# Patient Record
Sex: Male | Born: 1940 | Race: Black or African American | Hispanic: No | Marital: Married | Smoking: Current every day smoker
Health system: Southern US, Community
[De-identification: ages and names within clinical notes are randomized; demographics above are authoritative.]

## PROBLEM LIST (undated history)

## (undated) DIAGNOSIS — I10 Essential (primary) hypertension: Secondary | ICD-10-CM

## (undated) DIAGNOSIS — E119 Type 2 diabetes mellitus without complications: Secondary | ICD-10-CM

## (undated) DIAGNOSIS — E785 Hyperlipidemia, unspecified: Secondary | ICD-10-CM

## (undated) HISTORY — PX: APPENDECTOMY: SHX54

## (undated) HISTORY — DX: Essential (primary) hypertension: I10

## (undated) HISTORY — DX: Type 2 diabetes mellitus without complications: E11.9

## (undated) HISTORY — DX: Hyperlipidemia, unspecified: E78.5

---

## 2013-12-19 ENCOUNTER — Ambulatory Visit (INDEPENDENT_AMBULATORY_CARE_PROVIDER_SITE_OTHER): Payer: Medicare Other | Admitting: Podiatry

## 2013-12-19 ENCOUNTER — Encounter: Payer: Self-pay | Admitting: Podiatry

## 2013-12-19 VITALS — BP 170/92 | HR 78 | Resp 15 | Ht 74.5 in | Wt 230.0 lb

## 2013-12-19 DIAGNOSIS — L608 Other nail disorders: Secondary | ICD-10-CM

## 2013-12-19 NOTE — Progress Notes (Signed)
   Subjective:    Patient ID: Matthew Hooper, male    DOB: 06/01/1940, 73 y.o.   MRN: [id number]  HPI Comments: N debridement L 10 toenails D and O long-term C elongated, tender toenails A diabetes, difficult to cut T none  Diabetes  patient is had previous podiatric care while living in [place]    Review of Systems  All other systems reviewed and are negative.      Objective:   Physical Exam   Orientated 3  Vascular: DP pulses 2/4 bilaterally PT pulses 2/4 bilaterally  Neurological: Ankle reflex equal and reactive bilaterally Sensation to 10 g monofilament wire intact 5/5 bilaterally Vibratory sensation nonreactive bilaterally  Neurological: Texture and turgor within normal limits Distal left hallux demonstrates color change without any erythema surrounding the nail with mild incurvation The remaining toenails are normal trophic  Musculoskeletal Pes planus bilaterally  HAV deformities bilaterally No restriction ankle, subtalar, midtarsal joints bilaterally       Assessment & Plan:   Assessment: Satisfactory neurovascular status Protective sensation intact Mildly incurvated left hallux toenail without a clinical sign of bacterial infection Nail plates are normal trophic  Plan: I advised patient that he has satisfactory neurovascular status and encouraged him to stop smoking Made aware that his toenails and no significant deformity and I recommended home treatment for debridement I did debrided toenails today at patient's request  Reappoint yearly or at patient's request

## 2013-12-19 NOTE — Patient Instructions (Signed)
Diabetes and Foot Care Diabetes may cause you to have problems because of poor blood supply (circulation) to your feet and legs. This may cause the skin on your feet to become thinner, break easier, and heal more slowly. Your skin may become dry, and the skin may peel and crack. You may also have nerve damage in your legs and feet causing decreased feeling in them. You may not notice minor injuries to your feet that could lead to infections or more serious problems. Taking care of your feet is one of the most important things you can do for yourself.  HOME CARE INSTRUCTIONS  Wear shoes at all times, even in the house. Do not go barefoot. Bare feet are easily injured.  Check your feet daily for blisters, cuts, and redness. If you cannot see the bottom of your feet, use a mirror or ask someone for help.  Wash your feet with warm water (do not use hot water) and mild soap. Then pat your feet and the areas between your toes until they are completely dry. Do not soak your feet as this can dry your skin.  Apply a moisturizing lotion or petroleum jelly (that does not contain alcohol and is unscented) to the skin on your feet and to dry, brittle toenails. Do not apply lotion between your toes.  Trim your toenails straight across. Do not dig under them or around the cuticle. File the edges of your nails with an emery board or nail file.  Do not cut corns or calluses or try to remove them with medicine.  Wear clean socks or stockings every day. Make sure they are not too tight. Do not wear knee-high stockings since they may decrease blood flow to your legs.  Wear shoes that fit properly and have enough cushioning. To break in new shoes, wear them for just a few hours a day. This prevents you from injuring your feet. Always look in your shoes before you put them on to be sure there are no objects inside.  Do not cross your legs. This may decrease the blood flow to your feet.  If you find a minor scrape,  cut, or break in the skin on your feet, keep it and the skin around it clean and dry. These areas may be cleansed with mild soap and water. Do not cleanse the area with peroxide, alcohol, or iodine.  When you remove an adhesive bandage, be sure not to damage the skin around it.  If you have a wound, look at it several times a day to make sure it is healing.  Do not use heating pads or hot water bottles. They may burn your skin. If you have lost feeling in your feet or legs, you may not know it is happening until it is too late.  Make sure your health care provider performs a complete foot exam at least annually or more often if you have foot problems. Report any cuts, sores, or bruises to your health care provider immediately. SEEK MEDICAL CARE IF:   You have an injury that is not healing.  You have cuts or breaks in the skin.  You have an ingrown nail.  You notice redness on your legs or feet.  You feel burning or tingling in your legs or feet.  You have pain or cramps in your legs and feet.  Your legs or feet are numb.  Your feet always feel cold. SEEK IMMEDIATE MEDICAL CARE IF:   There is increasing redness,   swelling, or pain in or around a wound.  There is a red line that goes up your leg.  Pus is coming from a wound.  You develop a fever or as directed by your health care provider.  You notice a bad smell coming from an ulcer or wound. Document Released: 01/25/2000 Document Revised: 09/29/2012 Document Reviewed: 07/06/2012 ExitCare Patient Information 2015 [employer]. This information is not intended to replace advice given to you by your health care provider. Make sure you discuss any questions you have with your health care provider.  

## 2013-12-20 ENCOUNTER — Encounter: Payer: Self-pay | Admitting: Podiatry

## 2014-09-06 ENCOUNTER — Ambulatory Visit: Payer: Medicare Other | Admitting: Podiatry

## 2015-01-10 DIAGNOSIS — I10 Essential (primary) hypertension: Secondary | ICD-10-CM | POA: Insufficient documentation

## 2017-07-03 ENCOUNTER — Encounter: Payer: Self-pay | Admitting: Pulmonary Disease

## 2017-08-07 ENCOUNTER — Ambulatory Visit (INDEPENDENT_AMBULATORY_CARE_PROVIDER_SITE_OTHER): Payer: Medicare HMO | Admitting: Pulmonary Disease

## 2017-08-07 ENCOUNTER — Ambulatory Visit (INDEPENDENT_AMBULATORY_CARE_PROVIDER_SITE_OTHER)
Admission: RE | Admit: 2017-08-07 | Discharge: 2017-08-07 | Disposition: A | Discharge status: Home / Self Care | Payer: Medicare HMO | Source: Ambulatory Visit | Attending: Pulmonary Disease | Admitting: Pulmonary Disease

## 2017-08-07 ENCOUNTER — Encounter: Payer: Self-pay | Admitting: Pulmonary Disease

## 2017-08-07 VITALS — BP 130/70 | HR 89 | Ht 73.0 in | Wt 225.0 lb

## 2017-08-07 DIAGNOSIS — G4733 Obstructive sleep apnea (adult) (pediatric): Secondary | ICD-10-CM | POA: Diagnosis not present

## 2017-08-07 DIAGNOSIS — R0602 Shortness of breath: Secondary | ICD-10-CM

## 2017-08-07 DIAGNOSIS — F17200 Nicotine dependence, unspecified, uncomplicated: Secondary | ICD-10-CM | POA: Diagnosis not present

## 2017-08-07 DIAGNOSIS — Z720 Tobacco use: Secondary | ICD-10-CM | POA: Insufficient documentation

## 2017-08-07 NOTE — Assessment & Plan Note (Signed)
Given excessive daytime somnolence, narrow pharyngeal exam, witnessed apneas & loud snoring, obstructive sleep apnea is very likely & an overnight polysomnogram will be scheduled as a home study. The pathophysiology of obstructive sleep apnea , it's cardiovascular consequences & modes of treatment including CPAP were discused with the patient in detail & they evidenced understanding.  Pretest probability is intermediate  

## 2017-08-07 NOTE — Progress Notes (Signed)
Subjective:    Patient ID: Matthew Hooper, male    DOB: 11/21/1940, 77 y.o.   MRN: [id number]  HPI  77 year old smoker presents for evaluation of sleep disordered breathing. His wife accompanies him reports loud snoring and witnessed apneas where he pauses and gasps in his sleep.  She would also like me to discuss his smoking  Epworth sleepiness score is 8 and he reports sleepiness while watching TV or sitting idle in the afternoons. Bedtime is between 11 to 11:30 PM, sleep latency is minimal, he sleeps on his side with one pillow, reports 1-2 nocturnal awakenings for nocturia and is out of bed by 8:30 AM feeling good without headaches but does report dryness of mouth.  He is ever willing to take a nap for 1 to 2 hours in the afternoons and always feels refreshed. His weight has dropped 20 pounds in the last 2 years. He is to live in [place] ago .[place]   He is a [place] [employer] veteran and was honorably discharged from the [employer] in 1969 He smokes half pack per day for more than 50 years, more than 30 pack years denies cough or wheezing or recurrent episodes of bronchitis   Past Medical History:  Diagnosis Date  . Diabetes mellitus without complication (HCC)   . Hyperlipidemia   . Hypertension    History reviewed. No pertinent surgical history.  No Known Allergies   Social History   Socioeconomic History  . Marital status: Married    Spouse name: Not on file  . Number of children: Not on file  . Years of education: Not on file  . Highest education level: Not on file  Occupational History  . Not on file  Social Needs  . Financial resource strain: Not on file  . Food insecurity:    Worry: Not on file    Inability: Not on file  . Transportation needs:    Medical: Not on file    Non-medical: Not on file  Tobacco Use  . Smoking status: Current Every Day Smoker  . Smokeless tobacco: Never Used  Substance and Sexual Activity  .  Alcohol use: Not on file  . Drug use: Not on file  . Sexual activity: Not on file  Lifestyle  . Physical activity:    Days per week: Not on file    Minutes per session: Not on file  . Stress: Not on file  Relationships  . Social connections:    Talks on phone: Not on file    Gets together: Not on file    Attends religious service: Not on file    Active member of club or organization: Not on file    Attends meetings of clubs or organizations: Not on file    Relationship status: Not on file  . Intimate partner violence:    Fear of current or ex partner: Not on file    Emotionally abused: Not on file    Physically abused: Not on file    Forced sexual activity: Not on file  Other Topics Concern  . Not on file  Social History Narrative  . Not on file     History reviewed. No pertinent family history.   Review of Systems  Constitutional: negative for anorexia, fevers and sweats  Eyes: negative for irritation, redness and visual disturbance  Ears, nose, mouth, throat, and face: negative for earaches, epistaxis, nasal congestion and sore throat  Respiratory: negative for  cough, dyspnea on exertion, sputum and wheezing  Cardiovascular: negative for chest pain, dyspnea, lower extremity edema, orthopnea, palpitations and syncope  Gastrointestinal: negative for abdominal pain, constipation, diarrhea, melena, nausea and vomiting  Genitourinary:negative for dysuria, frequency and hematuria  Hematologic/lymphatic: negative for bleeding, easy bruising and lymphadenopathy  Musculoskeletal:negative for arthralgias, muscle weakness and stiff joints  Neurological: negative for coordination problems, gait problems, headaches and weakness  Endocrine: negative for diabetic symptoms including polydipsia, polyuria and weight loss     Objective:   Physical Exam  Gen. Pleasant, well-nourished,elderly, in no distress, normal affect ENT - no lesions, no post nasal drip Neck: No JVD, no  thyromegaly, no carotid bruits Lungs: no use of accessory muscles, no dullness to percussion, decreased  without rales or rhonchi  Cardiovascular: Rhythm regular, heart sounds  normal, no murmurs or gallops, no peripheral edema Abdomen: soft and non-tender, no hepatosplenomegaly, BS normal. Musculoskeletal: No deformities, no cyanosis or clubbing Neuro:  alert, non focal       Assessment & Plan:

## 2017-08-07 NOTE — Progress Notes (Signed)
   Subjective:    Patient ID: Matthew Hooper, male    DOB: 12/15/1940, 77 y.o.   MRN: [id number]  HPI    Review of Systems  Respiratory: Positive for cough, shortness of breath and wheezing.        Objective:   Physical Exam        Assessment & Plan:

## 2017-08-07 NOTE — Assessment & Plan Note (Signed)
Smoking cessation was discussed.  He does not want medications for this, he is not ready to commit to a quit attempt or quit date.  He will try to cut down his smoking.  I asked him to start with a nicotine patch  We also discussed screening for lung cancer and he would like to think about this

## 2017-08-07 NOTE — Patient Instructions (Signed)
Home sleep study  Refer to lung cancer screening program CXR today

## 2017-08-10 ENCOUNTER — Other Ambulatory Visit: Payer: Self-pay | Admitting: Acute Care

## 2017-08-10 DIAGNOSIS — Z122 Encounter for screening for malignant neoplasm of respiratory organs: Secondary | ICD-10-CM

## 2017-08-10 DIAGNOSIS — F17210 Nicotine dependence, cigarettes, uncomplicated: Principal | ICD-10-CM

## 2017-08-26 ENCOUNTER — Ambulatory Visit (INDEPENDENT_AMBULATORY_CARE_PROVIDER_SITE_OTHER): Payer: Medicare HMO | Admitting: Acute Care

## 2017-08-26 ENCOUNTER — Encounter: Payer: Self-pay | Admitting: Acute Care

## 2017-08-26 ENCOUNTER — Ambulatory Visit (INDEPENDENT_AMBULATORY_CARE_PROVIDER_SITE_OTHER)
Admission: RE | Admit: 2017-08-26 | Discharge: 2017-08-26 | Disposition: A | Discharge status: Home / Self Care | Payer: Medicare HMO | Source: Ambulatory Visit | Attending: Acute Care | Admitting: Acute Care

## 2017-08-26 DIAGNOSIS — F17210 Nicotine dependence, cigarettes, uncomplicated: Secondary | ICD-10-CM

## 2017-08-26 DIAGNOSIS — Z122 Encounter for screening for malignant neoplasm of respiratory organs: Secondary | ICD-10-CM

## 2017-08-26 NOTE — Progress Notes (Signed)
Shared Decision Making Visit Lung Cancer Screening Program [phone]([id number])   Eligibility:  Age 77 y.o.  Pack Years Smoking History Calculation 46 pack year smoking history (# packs/per year x # years smoked)  Recent History of coughing up blood  no  Unexplained weight loss? no ( >Than 15 pounds within the last 6 months )  Prior History Lung / other cancer no (Diagnosis within the last 5 years already requiring surveillance chest CT Scans).  Smoking Status Current Smoker  Former Smokers: Years since quit: NA  Quit Date: NA  Visit Components:  Discussion included one or more decision making aids. yes  Discussion included risk/benefits of screening. yes  Discussion included potential follow up diagnostic testing for abnormal scans. yes  Discussion included meaning and risk of over diagnosis. yes  Discussion included meaning and risk of False Positives. yes  Discussion included meaning of total radiation exposure. yes  Counseling Included:  Importance of adherence to annual lung cancer LDCT screening. yes  Impact of comorbidities on ability to participate in the program. yes  Ability and willingness to under diagnostic treatment. yes  Smoking Cessation Counseling:  Current Smokers:   Discussed importance of smoking cessation. yes  Information about tobacco cessation classes and interventions provided to patient. yes  Patient provided with "ticket" for LDCT Scan. yes  Symptomatic Patient. no  Counseling  Diagnosis Code: Tobacco Use Z72.0  Asymptomatic Patient yes  Counseling (Intermediate counseling: > three minutes counseling) [id number]  Former Smokers:   Discussed the importance of maintaining cigarette abstinence. yes  Diagnosis Code: Personal History of Nicotine Dependence. W29.562Z87.891  Information about tobacco cessation classes and interventions provided to patient. Yes  Patient provided with "ticket" for LDCT Scan. yes  Written Order for Lung Cancer  Screening with LDCT placed in Epic. Yes (CT Chest Lung Cancer Screening Low Dose W/O CM) [id number] Z12.2-Screening of respiratory organs Z87.891-Personal history of nicotine dependence  I have spent 25 minutes of face to face time with Mr. Brooke DareKing and his wife discussing the risks and benefits of lung cancer screening. We viewed a power point together that explained in detail the above noted topics. We paused at intervals to allow for questions to be asked and answered to ensure understanding.We discussed that the single most powerful action that he can take to decrease his risk of developing lung cancer is to quit smoking. We discussed whether or not he is ready to commit to setting a quit date. He is currently not ready to set  Quit date. We discussed options for tools to aid in quitting smoking including nicotine replacement therapy, non-nicotine medications, support groups, Quit Smart classes, and behavior modification. We discussed that often times setting smaller, more achievable goals, such as eliminating 1 cigarette a day for a week and then 2 cigarettes a day for a week can be helpful in slowly decreasing the number of cigarettes smoked. This allows for a sense of accomplishment as well as providing a clinical benefit. I gave him the " Be Stronger Than Your Excuses" card with contact information for community resources, classes, free nicotine replacement therapy, and access to mobile apps, text messaging, and on-line smoking cessation help. I have also given him my card and contact information in the event he needs to contact me. We discussed the time and location of the scan, and that either Abigail Miyamotoenise Phelps RN or I will call with the results within 24-48 hours of receiving them. I have offered him  a copy of the power point  we viewed  as a resource in the event they need reinforcement of the concepts we discussed today in the office. The patient verbalized understanding of all of  the above and had no  further questions upon leaving the office. They have my contact information in the event they have any further questions.  I spent 3 minutes counseling on smoking cessation and the health risks of continued tobacco abuse.  I explained to the patient that there has been a high incidence of coronary artery disease noted on these exams. I explained that this is a non-gated exam therefore degree or severity cannot be determined. This patient is currently on statin therapy. I have asked the patient to follow-up with their PCP regarding any incidental finding of coronary artery disease and management with diet or medication as their PCP  feels is clinically indicated. The patient verbalized understanding of the above and had no further questions upon completion of the visit.      Bevelyn Ngo, NP  08/26/2017 11:32 AM

## 2017-09-01 ENCOUNTER — Other Ambulatory Visit: Payer: Self-pay | Admitting: Acute Care

## 2017-09-01 DIAGNOSIS — G4733 Obstructive sleep apnea (adult) (pediatric): Secondary | ICD-10-CM | POA: Diagnosis not present

## 2017-09-01 DIAGNOSIS — F17210 Nicotine dependence, cigarettes, uncomplicated: Principal | ICD-10-CM

## 2017-09-01 DIAGNOSIS — Z122 Encounter for screening for malignant neoplasm of respiratory organs: Secondary | ICD-10-CM

## 2017-09-02 DIAGNOSIS — G4733 Obstructive sleep apnea (adult) (pediatric): Secondary | ICD-10-CM | POA: Diagnosis not present

## 2017-09-03 ENCOUNTER — Other Ambulatory Visit: Payer: Self-pay | Admitting: *Deleted

## 2017-09-03 ENCOUNTER — Telehealth: Payer: Self-pay | Admitting: Pulmonary Disease

## 2017-09-03 DIAGNOSIS — G4733 Obstructive sleep apnea (adult) (pediatric): Secondary | ICD-10-CM

## 2017-09-03 NOTE — Telephone Encounter (Signed)
Per RA, HST showed moderate OSA with 18 events per hour. He suggests auto cpap 5-15cm, mask of choice and an OV in 6 weeks.

## 2017-09-15 NOTE — Telephone Encounter (Signed)
Left message for patient to call back for results.  

## 2017-09-17 NOTE — Telephone Encounter (Signed)
Pt is returning call. Cb is [phone].

## 2017-09-17 NOTE — Telephone Encounter (Signed)
Called spoke with patient and spouse (on speaker phone).  Discussed HST results and recommendations at length.  Patient and spouse okay with proceeding with CPAP therapy >> order placed. Appt scheduled with RA for 10.10.19 @ 1445 for new CPAP follow up.  Appt card mailed to patient with copy of HST results as requested by spouse. HST results and copy of this phone note sent to PCP as requesting by spouse.  Nothing further needed at this time; will sign off.

## 2017-10-30 ENCOUNTER — Encounter: Payer: Self-pay | Admitting: Podiatry

## 2017-10-30 ENCOUNTER — Ambulatory Visit (INDEPENDENT_AMBULATORY_CARE_PROVIDER_SITE_OTHER): Payer: Medicare HMO | Admitting: Podiatry

## 2017-10-30 ENCOUNTER — Encounter

## 2017-10-30 VITALS — BP 140/78 | HR 73

## 2017-10-30 DIAGNOSIS — E119 Type 2 diabetes mellitus without complications: Secondary | ICD-10-CM | POA: Diagnosis not present

## 2017-10-30 NOTE — Patient Instructions (Signed)

## 2017-11-05 NOTE — Progress Notes (Signed)
Subjective: Mr. Dase presents today accompanied by his wife. He is here for today for Diabetic Foot Examination.   Patient has been diagnosed with Type 2 diabetes  Current Diabetes Management Regimen is:  Oral Medication: Metformin, 1000mg  po bid  PCP: [name], MD   Current  Pedal History: 1. Any change in the foot or feet since last evaluation? Had a blister on his right foot which has resolved. Unsure of cause of blister.  Relates no pus, no swelling, no other drainage. 2.  Current ulcer or history of foot ulcer? No 3. Is there pain in the calf muscles when walking that is relieved by rest? No  Medical History   Date Unknown Diabetes mellitus without complication (HCC)  Date Unknown Hyperlipidemia  Date Unknown Hypertension   Problem List   New problems from outside sources are available for reconciliation  Respiratory  OSA (obstructive sleep apnea)   Other  Tobacco abuse    Surgical History   None   Medications    losartan (COZAAR) 50 MG tablet    metFORMIN (GLUCOPHAGE) 1000 MG tablet    pravastatin (PRAVACHOL) 80 MG tablet    Allergies      No Known Allergies  Mark as: Review Complete   Family History    None   ROS: Per HPI unless specifically indicated in ROS section   Objective:  Vitals:   10/30/17 0816  BP: 140/78  Pulse: 73   Foot Examination:  Vascular Examination: Capillary refill time immediate x 10 digits Dorsalis pedis pulses palpable b/l Posterior tibial pulses palpable b/l Sparse digital hair x 10 digits Skin temperature gradient normal b/l  Dermatological Examination:  Skin with normal turgor, texture and tone b/l   Toenails 1-5 b/l nondystrophic with adequate length b/l  No hyperkeratoses b/l  No ulcerations noted b/l No interdigital maceration noted bl   Musculoskeletal: Muscle strength 5/5 to all LE muscle groups No gross pedal deformities noted b/l  Sensory Foot Examination: Sensation tested with 10 gram  monofilament: intact to all below Right great toe plantarly Right 4th toe plantarly Submetatarsal head 1 right foot Submetatarsal head 3 right foot Submetatarsal head 5 right foot Left great toe plantarly Left 4th toe plantarly Submetatarsal head 1 left foot Submetatarsal head 3 left foot Submetatarsal head 5 left foot  Risk Categorization: Low Risk Patient has all of the following: Intact protective sensation No prior foot ulcer  No severe deformity Pedal pulses present  Footwear Assessment: Does the patient wear appropriate shoes? Yes Does the patient need inserts/orthotics? Not presently  Assessment: 1. NIDDM  Management Plan: Patient underwent complete diabetic foot examination on today. 1. Pt education provided for preventative foot care. Dispensed AVS on diabetic foot care. 2. Patient to report any pedal injuries to medical professional  3. Patient will need annual diabetic foot examination. He is to call the office should he have any pedal problems in the interim. He and his wife related undersanding.

## 2017-11-19 ENCOUNTER — Ambulatory Visit (INDEPENDENT_AMBULATORY_CARE_PROVIDER_SITE_OTHER): Payer: Medicare HMO | Admitting: Pulmonary Disease

## 2017-11-19 ENCOUNTER — Encounter: Payer: Self-pay | Admitting: Pulmonary Disease

## 2017-11-19 DIAGNOSIS — Z720 Tobacco use: Secondary | ICD-10-CM | POA: Diagnosis not present

## 2017-11-19 DIAGNOSIS — G4733 Obstructive sleep apnea (adult) (pediatric): Secondary | ICD-10-CM

## 2017-11-19 NOTE — Progress Notes (Signed)
   Subjective:    Patient ID: Matthew Hooper, male    DOB: [birth year], 77 y.o.   MRN: [id number]  HPI  77 year old smoker for follow-up of OSA He has settled in with his CPAP machine which he obtained after sleep study showed moderate OSA.  He has learned to put the mask on by himself, uses the bathroom letter, pressure is okay, has settled with full facemask. Does not have dryness, using distilled water. CPAP download was reviewed which shows good compliance average of 5 hours every night, mild leak, average pressure of 13 cm with auto settings 5 to 15 cm and no missed nights.  He continues to smoke but is trying to cut down. Reviewed low-dose CT screening results with him  Significant tests/ events reviewed  08/2017 HST >> moderate OSA with 18/h  LDCT 08/2017 >> RADS 1S  Review of Systems neg for any significant sore throat, dysphagia, itching, sneezing, nasal congestion or excess/ purulent secretions, fever, chills, sweats, unintended wt loss, pleuritic or exertional cp, hempoptysis, orthopnea pnd or change in chronic leg swelling. Also denies presyncope, palpitations, heartburn, abdominal pain, nausea, vomiting, diarrhea or change in bowel or urinary habits, dysuria,hematuria, rash, arthralgias, visual complaints, headache, numbness weakness or ataxia.     Objective:   Physical Exam   Gen. Pleasant, well-nourished, in no distress ENT - no pallor, icterus Neck: No JVD, no thyromegaly, no carotid bruits Lungs: no use of accessory muscles, no dullness to percussion, clear without rales or rhonchi  Cardiovascular: Rhythm regular, heart sounds  normal, no murmurs or gallops, no peripheral edema Musculoskeletal: No deformities, no cyanosis or clubbing         Assessment & Plan:

## 2017-11-19 NOTE — Assessment & Plan Note (Signed)
Weight loss encouraged, compliance with goal of at least 4-6 hrs every night is the expectation. Advised against medications with sedative side effects Cautioned against driving when sleepy - understanding that sleepiness will vary on a day to day basis   CPAP is definitely helping him, improve his daytime somnolence and fatigue.  He is compliant

## 2017-11-19 NOTE — Assessment & Plan Note (Signed)
Smoking cessation again emphasized. Low-dose CT scan in 1 year

## 2017-11-19 NOTE — Patient Instructions (Signed)
CPAP is working well Try to use at least h every night

## 2018-05-21 ENCOUNTER — Ambulatory Visit: Payer: Medicare HMO | Admitting: Primary Care

## 2018-06-24 ENCOUNTER — Ambulatory Visit: Payer: Medicare HMO | Admitting: Primary Care

## 2018-09-06 ENCOUNTER — Ambulatory Visit: Payer: Medicare HMO | Admitting: Podiatry

## 2018-09-06 NOTE — Progress Notes (Addendum)
 @Patient  ID: Matthew SauerAlan Hooper, male    DOB: 01/02/1941, 78 y.o.   MRN: [id number]  Chief Complaint  Patient presents with  . Follow-up    Patient reports that his CPAP machine is worjing well for him. He reports no issues with the mask and he gets a restfull sleep.    Referring provider: Eartha InchBadger, Michael C, MD  HPI: 78 year old MALE, current smoker. PMH significant for OSA, tobacco abuse. Patient of Dr. Vassie LollAlva, last seen on 11/19/17. HST 08/2017 showed moderate OSA with 18/h. Maintained on auto CPAP 5-15cm h20. Following with low-dose lung cancer screening program. LDCT 08/2017 >> RADS 1S.  09/07/2018 Patient presents today for OSA 6 month follow-up. He is doing well, no complaints. Moderate compliance with CPAP. States that he forgets to put mask on a few times because he falls asleep on the couch. He reports sleeping well at night with less snoring. Feels well rested when he wakes up in the morning.   Airview download: 14/30 days (47%); 14 days >4 hours Average usage days used 6 hours 25 mins Pressure 5-15cm H20 (12.2) Leaks 29.2L/min AHI 2.2    No Known Allergies  Immunization History  Administered Date(s) Administered  . Influenza, High Dose Seasonal PF 11/02/2015, 01/10/2017  . Influenza,inj,Quad PF,6+ Mos 11/10/2017  . Pneumococcal Conjugate-13 10/10/2016  . Pneumococcal Polysaccharide-23 01/10/2015    Past Medical History:  Diagnosis Date  . Diabetes mellitus without complication (HCC)   . Hyperlipidemia   . Hypertension     Tobacco History: Social History   Tobacco Use  Smoking Status Current Every Day Smoker  . Packs/day: 0.75  . Years: 62.00  . Pack years: 46.50  . Types: Cigarettes  Smokeless Tobacco Never Used  Tobacco Comment   Contemplative   Ready to quit: Not Answered Counseling given: Not Answered Comment: Contemplative   Outpatient Medications Prior to Visit  Medication Sig Dispense Refill  . losartan (COZAAR) 50 MG tablet Take 50 mg by mouth daily.     . metFORMIN (GLUCOPHAGE) 1000 MG tablet Take 1,000 mg by mouth 2 (two) times daily with a meal.    . pravastatin (PRAVACHOL) 80 MG tablet Take 80 mg by mouth daily.     No facility-administered medications prior to visit.    Review of Systems  Review of Systems  Constitutional: Negative.   HENT: Negative.   Respiratory: Negative.   Cardiovascular: Negative.   Psychiatric/Behavioral: Negative.    Physical Exam  BP 118/66 (BP Location: Left Arm, Cuff Size: Normal)   Pulse 71   Temp 97.7 F (36.5 C) (Oral)   Ht 6' 2.5" (1.892 m)   Wt 223 lb 9.6 oz (101.4 kg)   SpO2 96%   BMI 28.32 kg/m  Physical Exam Constitutional:      General: He is not in acute distress.    Appearance: Normal appearance. He is not ill-appearing.  HENT:     Head: Normocephalic and atraumatic.     Nose: Nose normal.  Neck:     Musculoskeletal: Normal range of motion.  Cardiovascular:     Rate and Rhythm: Normal rate and regular rhythm.  Pulmonary:     Effort: Pulmonary effort is normal.     Breath sounds: Normal breath sounds.  Musculoskeletal: Normal range of motion.  Skin:    General: Skin is warm and dry.  Neurological:     General: No focal deficit present.     Mental Status: He is alert and oriented to person, place, and  time. Mental status is at baseline.  Psychiatric:        Mood and Affect: Mood normal.        Behavior: Behavior normal.        Thought Content: Thought content normal.        Judgment: Judgment normal.      Lab Results:  CBC No results found for: WBC, RBC, HGB, HCT, PLT, MCV, MCH, MCHC, RDW, LYMPHSABS, MONOABS, EOSABS, BASOSABS  BMET No results found for: NA, K, CL, CO2, GLUCOSE, BUN, CREATININE, CALCIUM, GFRNONAA, GFRAA  BNP No results found for: BNP  ProBNP No results found for: PROBNP  Imaging: No results found.   Assessment & Plan:   OSA (obstructive sleep apnea) - Moderate compliance with CPAP and reports benefit from use - Occasionally falls  asleep before putting mask on, discussed ways to remind him such as setting an alarm - Pressure 5-15cm h20; AHI 2.2 - No changes today - Continue to encourage enhanced compliance - FU in 6 months  Tobacco abuse -  LDCT 08/2017 - RADS 1S - Scheduled for annual scan on 10/21/18 at [place], NP 09/07/2018

## 2018-09-07 ENCOUNTER — Ambulatory Visit (INDEPENDENT_AMBULATORY_CARE_PROVIDER_SITE_OTHER): Payer: Medicare Other | Admitting: Primary Care

## 2018-09-07 ENCOUNTER — Other Ambulatory Visit: Payer: Self-pay

## 2018-09-07 ENCOUNTER — Encounter: Payer: Self-pay | Admitting: Primary Care

## 2018-09-07 DIAGNOSIS — G4733 Obstructive sleep apnea (adult) (pediatric): Secondary | ICD-10-CM | POA: Diagnosis not present

## 2018-09-07 DIAGNOSIS — Z720 Tobacco use: Secondary | ICD-10-CM | POA: Diagnosis not present

## 2018-09-07 NOTE — Patient Instructions (Signed)
Nice to meet you! Glad you are doing well  Work hard to wearing CPAP every night (think of ways to remind your self to put mask on- set an alarm)  Do not drive if experiencing excessive daytime fatigue on somnolence  Low dose CT scheduled for 10/21/18 at 1pm  Follow up in 6 months with Dr. Elsworth Soho or NP

## 2018-09-07 NOTE — Assessment & Plan Note (Addendum)
-   Moderate compliance with CPAP and reports benefit from use - Occasionally falls asleep before putting mask on, discussed ways to remind him such as setting an alarm - Pressure 5-15cm h20; AHI 2.2 - No changes today - Continue to encourage enhanced compliance - FU in 6 months

## 2018-09-07 NOTE — Assessment & Plan Note (Signed)
-    LDCT 08/2017 - RADS 1S - Scheduled for annual scan on 10/21/18 at 1pm

## 2018-10-21 ENCOUNTER — Other Ambulatory Visit: Payer: Self-pay

## 2018-10-21 ENCOUNTER — Ambulatory Visit (INDEPENDENT_AMBULATORY_CARE_PROVIDER_SITE_OTHER)
Admission: RE | Admit: 2018-10-21 | Discharge: 2018-10-21 | Disposition: A | Discharge status: Home / Self Care | Payer: Medicare Other | Source: Ambulatory Visit | Attending: Acute Care | Admitting: Acute Care

## 2018-10-21 DIAGNOSIS — Z122 Encounter for screening for malignant neoplasm of respiratory organs: Secondary | ICD-10-CM

## 2018-10-21 DIAGNOSIS — F17210 Nicotine dependence, cigarettes, uncomplicated: Secondary | ICD-10-CM

## 2018-11-02 ENCOUNTER — Other Ambulatory Visit: Payer: Self-pay | Admitting: *Deleted

## 2018-11-02 DIAGNOSIS — Z122 Encounter for screening for malignant neoplasm of respiratory organs: Secondary | ICD-10-CM

## 2018-11-02 DIAGNOSIS — Z87891 Personal history of nicotine dependence: Secondary | ICD-10-CM

## 2018-11-02 DIAGNOSIS — F17210 Nicotine dependence, cigarettes, uncomplicated: Secondary | ICD-10-CM

## 2018-11-05 ENCOUNTER — Ambulatory Visit: Payer: Medicare Other | Admitting: Podiatry

## 2019-03-08 ENCOUNTER — Other Ambulatory Visit: Payer: Self-pay

## 2019-03-08 ENCOUNTER — Ambulatory Visit (INDEPENDENT_AMBULATORY_CARE_PROVIDER_SITE_OTHER): Payer: Medicare HMO | Admitting: Podiatry

## 2019-03-08 ENCOUNTER — Encounter: Payer: Self-pay | Admitting: Podiatry

## 2019-03-08 DIAGNOSIS — E119 Type 2 diabetes mellitus without complications: Secondary | ICD-10-CM | POA: Diagnosis not present

## 2019-03-08 DIAGNOSIS — M79675 Pain in left toe(s): Secondary | ICD-10-CM | POA: Diagnosis not present

## 2019-03-08 DIAGNOSIS — B351 Tinea unguium: Secondary | ICD-10-CM

## 2019-03-08 DIAGNOSIS — M79674 Pain in right toe(s): Secondary | ICD-10-CM

## 2019-03-10 ENCOUNTER — Ambulatory Visit: Payer: Medicare Other | Admitting: Primary Care

## 2019-03-14 NOTE — Progress Notes (Addendum)
@Patient  ID: , male    DOB: [birth year], 79 y.o.   MRN: [id number]  Chief Complaint  Patient presents with  . Follow-up    f/u OSA    Referring provider: [id number], MD  HPI: 79 year old MALE, current smoker. PMH significant for OSA, tobacco abuse. Patient of Dr. 62, last seen on 11/19/17. HST 08/2017 showed moderate OSA with 18/h. Maintained on auto CPAP 5-15cm h20. Following with low-dose lung cancer screening program. LDCT 10/29/18 >> Lung- RADS 2 (08/2017 - RADS 1S).   Previous [facility] pulmonary encounter: 09/07/2018 Patient presents today for OSA 6 month follow-up. He is doing well, no complaints. Moderate compliance with CPAP. States that he forgets to put mask on a few times because he falls asleep on the couch. He reports sleeping well at night with less snoring. Feels well rested when he wakes up in the morning.  Airview download: 14/30 days (47%); 14 days >4 hours. Pressure 5-15cm H20 (12.2); AHI 2.2   03/15/2019 Patient presents today for 6 month OSA follow-up. He is doing well, no acute complaints. He is mostly compliant with CPAP. States that he has had early doctors appointment and had recent family member pass away. No issues with machine, mask fit or pressure setting. Sleeping well through the night with less snoring. Takes naps during the day but does not use CPAP then. No trouble breathing. He is still smoking 0.75 ppd, he would like to quit and states that the 05/13/2019 gave him nicotine patches. His wife is his support system. Denies shortness of breath, chest tightness or wheezing.   Airview download 02/11/19-03/12/19: Usage 12/30 days; 12 days > 4 hours Average usage days used- 6 hours 14 mins Pressure 5-15cm h20 (14.5- 95%) Leaks 28.9L/min AHI 3.2   No Known Allergies  Immunization History  Administered Date(s) Administered  . Fluad Quad(high Dose 65+) 11/11/2018  . Influenza, High Dose Seasonal PF 11/02/2015, 01/10/2017  . Influenza,inj,Quad PF,6+ Mos  11/10/2017  . Influenza-Unspecified 11/10/2017  . Pneumococcal Conjugate-13 10/10/2016  . Pneumococcal Polysaccharide-23 01/10/2015    Past Medical History:  Diagnosis Date  . Diabetes mellitus without complication (HCC)   . Hyperlipidemia   . Hypertension     Tobacco History: Social History   Tobacco Use  Smoking Status Current Every Day Smoker  . Packs/day: 0.75  . Years: 62.00  . Pack years: 46.50  . Types: Cigarettes  Smokeless Tobacco Never Used  Tobacco Comment   1/2 per day    Ready to quit: Not Answered Counseling given: Not Answered Comment: 1/2 per day    Outpatient Medications Prior to Visit  Medication Sig Dispense Refill  . dorzolamide (TRUSOPT) 2 % ophthalmic solution     . losartan (COZAAR) 50 MG tablet Take 50 mg by mouth daily.    . metFORMIN (GLUCOPHAGE) 1000 MG tablet Take 1,000 mg by mouth 2 (two) times daily with a meal.    . pravastatin (PRAVACHOL) 80 MG tablet Take 80 mg by mouth daily.    . tamsulosin (FLOMAX) 0.4 MG CAPS capsule Take by mouth.    . valsartan (DIOVAN) 160 MG tablet     . D3-50 1.25 MG (50000 UT) capsule Take 50,000 Units by mouth as directed.     No facility-administered medications prior to visit.   Review of Systems  Review of Systems  Constitutional: Negative.   HENT: Negative.   Respiratory: Negative for cough, chest tightness, shortness of breath and wheezing.   Cardiovascular: Negative.  Physical Exam  BP 130/78 (BP Location: Right Arm, Patient Position: Sitting, Cuff Size: Normal)   Pulse 84   Temp (!) 97.3 F (36.3 C) (Temporal)   Ht 6' 2.5" (1.892 m)   Wt 224 lb 9.6 oz (101.9 kg)   SpO2 99% Comment: room air  BMI 28.45 kg/m  Physical Exam Constitutional:      General: He is not in acute distress.    Appearance: Normal appearance. He is not ill-appearing.  HENT:     Mouth/Throat:     Mouth: Mucous membranes are moist.     Pharynx: Oropharynx is clear.  Cardiovascular:     Rate and Rhythm: Normal  rate and regular rhythm.  Pulmonary:     Effort: Pulmonary effort is normal.     Breath sounds: Normal breath sounds. No wheezing or rhonchi.  Musculoskeletal:        General: Normal range of motion.  Neurological:     General: No focal deficit present.     Mental Status: He is alert and oriented to person, place, and time. Mental status is at baseline.  Psychiatric:        Mood and Affect: Mood normal.        Behavior: Behavior normal.        Thought Content: Thought content normal.        Judgment: Judgment normal.      Lab Results:  CBC No results found for: WBC, RBC, HGB, HCT, PLT, MCV, MCH, MCHC, RDW, LYMPHSABS, MONOABS, EOSABS, BASOSABS  BMET No results found for: NA, K, CL, CO2, GLUCOSE, BUN, CREATININE, CALCIUM, GFRNONAA, GFRAA  BNP No results found for: BNP  ProBNP No results found for: PROBNP  Imaging: No results found.   Assessment & Plan:   OSA (obstructive sleep apnea) - Moderate compliance with CPAP - Pressure 5-15cm h20; AHI 3.2 - Encourage patient to continue to wear CPAP EVERY NIGHT for minimum of 4-6 hours (and while taking naps) - Advised not to drive if experiencing excessive daytime fatigue or somnolence    Tobacco abuse - Active smoker - Following with lung cancer screening program  - He is interested in quitting smoking (has nicotine patches from [place]) - Discussed pharmacy consult, patient will consider    Martyn Ehrich, NP 03/15/2019

## 2019-03-15 ENCOUNTER — Encounter: Payer: Self-pay | Admitting: Primary Care

## 2019-03-15 ENCOUNTER — Ambulatory Visit (INDEPENDENT_AMBULATORY_CARE_PROVIDER_SITE_OTHER): Payer: Medicare HMO | Admitting: Primary Care

## 2019-03-15 ENCOUNTER — Other Ambulatory Visit: Payer: Self-pay

## 2019-03-15 VITALS — BP 130/78 | HR 84 | Temp 97.3°F | Ht 74.5 in | Wt 224.6 lb

## 2019-03-15 DIAGNOSIS — G4733 Obstructive sleep apnea (adult) (pediatric): Secondary | ICD-10-CM

## 2019-03-15 DIAGNOSIS — Z720 Tobacco use: Secondary | ICD-10-CM | POA: Diagnosis not present

## 2019-03-15 DIAGNOSIS — Z9989 Dependence on other enabling machines and devices: Secondary | ICD-10-CM

## 2019-03-15 NOTE — Assessment & Plan Note (Signed)
-   Moderate compliance with CPAP - Pressure 5-15cm h20; AHI 3.2 - Encourage patient to continue to wear CPAP EVERY NIGHT for minimum of 4-6 hours (and while taking naps) - Advised not to drive if experiencing excessive daytime fatigue or somnolence

## 2019-03-15 NOTE — Patient Instructions (Addendum)
Sleep apnea: - AIM to wear CPAP mask every night for 4-6 hours or more (and while napping) - Do not drive if experiencing excessive daytime fatigue or somnolence   Smoking cessation: - Use nicotine patches, taper the amount you smoke and pick a quit date  - When you are ready to quit smoking please call our office to make an appointment with our on-site pharmacist to help you with smoking cessation   Follow-up: - 6 months with Dr. Elsworth Soho  - Continue to follow up with lung cancer screening program (next due sept 2021)   CPAP and BPAP Information CPAP and BPAP are methods of helping a person breathe with the use of air pressure. CPAP stands for "continuous positive airway pressure." BPAP stands for "bi-level positive airway pressure." In both methods, air is blown through your nose or mouth and into your air passages to help you breathe well. CPAP and BPAP use different amounts of pressure to blow air. With CPAP, the amount of pressure stays the same while you breathe in and out. With BPAP, the amount of pressure is increased when you breathe in (inhale) so that you can take larger breaths. Your health care provider will recommend whether CPAP or BPAP would be more helpful for you. Why are CPAP and BPAP treatments used? CPAP or BPAP can be helpful if you have:  Sleep apnea.  Chronic obstructive pulmonary disease (COPD).  Heart failure.  Medical conditions that weaken the muscles of the chest including muscular dystrophy, or neurological diseases such as amyotrophic lateral sclerosis (ALS).  Other problems that cause breathing to be weak, abnormal, or difficult. CPAP is most commonly used for obstructive sleep apnea (OSA) to keep the airways from collapsing when the muscles relax during sleep. How is CPAP or BPAP administered? Both CPAP and BPAP are provided by a small machine with a flexible plastic tube that attaches to a plastic mask. You wear the mask. Air is blown through the mask into  your nose or mouth. The amount of pressure that is used to blow the air can be adjusted on the machine. Your health care provider will determine the pressure setting that should be used based on your individual needs. When should CPAP or BPAP be used? In most cases, the mask only needs to be worn during sleep. Generally, the mask needs to be worn throughout the night and during any daytime naps. People with certain medical conditions may also need to wear the mask at other times when they are awake. Follow instructions from your health care provider about when to use the machine. What are some tips for using the mask?   Because the mask needs to be snug, some people feel trapped or closed-in (claustrophobic) when first using the mask. If you feel this way, you may need to get used to the mask. One way to do this is by holding the mask loosely over your nose or mouth and then gradually applying the mask more snugly. You can also gradually increase the amount of time that you use the mask.  Masks are available in various types and sizes. Some fit over your mouth and nose while others fit over just your nose. If your mask does not fit well, talk with your health care provider about getting a different one.  If you are using a mask that fits over your nose and you tend to breathe through your mouth, a chin strap may be applied to help keep your mouth closed.  The CPAP and BPAP machines have alarms that may sound if the mask comes off or develops a leak.  If you have trouble with the mask, it is very important that you talk with your health care provider about finding a way to make the mask easier to tolerate. Do not stop using the mask. Stopping the use of the mask could have a negative impact on your health. What are some tips for using the machine?  Place your CPAP or BPAP machine on a secure table or stand near an electrical outlet.  Know where the on/off switch is located on the machine.  Follow  instructions from your health care provider about how to set the pressure on your machine and when you should use it.  Do not eat or drink while the CPAP or BPAP machine is on. Food or fluids could get pushed into your lungs by the pressure of the CPAP or BPAP.  Do not smoke. Tobacco smoke residue can damage the machine.  For home use, CPAP and BPAP machines can be rented or purchased through home health care companies. Many different brands of machines are available. Renting a machine before purchasing may help you find out which particular machine works well for you.  Keep the CPAP or BPAP machine and attachments clean. Ask your health care provider for specific instructions. Get help right away if:  You have redness or open areas around your nose or mouth where the mask fits.  You have trouble using the CPAP or BPAP machine.  You cannot tolerate wearing the CPAP or BPAP mask.  You have pain, discomfort, and bloating in your abdomen. Summary  CPAP and BPAP are methods of helping a person breathe with the use of air pressure.  Both CPAP and BPAP are provided by a small machine with a flexible plastic tube that attaches to a plastic mask.  If you have trouble with the mask, it is very important that you talk with your health care provider about finding a way to make the mask easier to tolerate. This information is not intended to replace advice given to you by your health care provider. Make sure you discuss any questions you have with your health care provider. Document Revised: 05/19/2018 Document Reviewed: 12/17/2015 Elsevier Patient Education  2020 [employer].

## 2019-03-15 NOTE — Progress Notes (Signed)
Subjective:   Patient ID: Matthew Hooper, male   DOB: 79 y.o.   MRN: [id number]   HPI 79 year old male presents the office today for concerns of thick, discolored, elongated toenails that he cannot trim himself.  He denies any redness or drainage or any swelling to the toenail sites.  He has no other concerns today.   Review of Systems  All other systems reviewed and are negative.  Past Medical History:  Diagnosis Date  . Diabetes mellitus without complication (HCC)   . Hyperlipidemia   . Hypertension     History reviewed. No pertinent surgical history.   Current Outpatient Medications:  .  tamsulosin (FLOMAX) 0.4 MG CAPS capsule, Take by mouth., Disp: , Rfl:  .  dorzolamide (TRUSOPT) 2 % ophthalmic solution, , Disp: , Rfl:  .  losartan (COZAAR) 50 MG tablet, Take 50 mg by mouth daily., Disp: , Rfl:  .  metFORMIN (GLUCOPHAGE) 1000 MG tablet, Take 1,000 mg by mouth 2 (two) times daily with a meal., Disp: , Rfl:  .  pravastatin (PRAVACHOL) 80 MG tablet, Take 80 mg by mouth daily., Disp: , Rfl:  .  valsartan (DIOVAN) 160 MG tablet, , Disp: , Rfl:   No Known Allergies       Objective:  Physical Exam  General: AAO x3, NAD  Dermatological: Skin is warm, dry and supple bilateral. Nails x 10 are well manicured; remaining integument appears unremarkable at this time. There are no open sores, no preulcerative lesions, no rash or signs of infection present.  Vascular: Dorsalis Pedis artery and Posterior Tibial artery pedal pulses are 2/4 bilateral with immedate capillary fill time.There is no pain with calf compression, swelling, warmth, erythema.   Neruologic: Grossly intact via light touch bilateral.   Musculoskeletal: No gross boney pedal deformities bilateral  Gait: Unassisted, Nonantalgic.       Assessment:   Symptomatic onychomycosis     Plan:  -Treatment options discussed including all alternatives, risks, and complications -Etiology of symptoms were discussed -Nails  debrided 10 without complications or bleeding. -Daily foot inspection -Follow-up in 3 months or sooner if any problems arise. In the meantime, encouraged to call the office with any questions, concerns, change in symptoms.   Ovid Curd, DPM

## 2019-03-15 NOTE — Assessment & Plan Note (Addendum)
-   Active smoker - Following with lung cancer screening program  - He is interested in quitting smoking (has nicotine patches from [place]) - Discussed pharmacy consult, patient will consider

## 2019-03-26 ENCOUNTER — Ambulatory Visit: Payer: Medicare HMO | Attending: Internal Medicine

## 2019-03-26 DIAGNOSIS — Z23 Encounter for immunization: Secondary | ICD-10-CM | POA: Insufficient documentation

## 2019-03-26 NOTE — Progress Notes (Signed)
   Covid-19 Vaccination Clinic  Name:  Matthew Hooper    MRN: [id number] DOB: 21-Jun-1940  03/26/2019  Mr. Matthew Hooper was observed post Covid-19 immunization for 15 minutes without incidence. He was provided with Vaccine Information Sheet and instruction to access the V-Safe system.   Mr. Matthew Hooper was instructed to call 911 with any severe reactions post vaccine: Marland Kitchen Difficulty breathing  . Swelling of your face and throat  . A fast heartbeat  . A bad rash all over your body  . Dizziness and weakness    Immunizations Administered    Name Date Dose VIS Date Route   Pfizer COVID-19 Vaccine 03/26/2019  1:54 PM 0.3 mL 01/21/2019 Intramuscular   Manufacturer: [employer], [employer]   Lot: [id number]   NDC: [address]-7

## 2019-04-18 ENCOUNTER — Ambulatory Visit: Payer: Medicare HMO | Attending: Internal Medicine

## 2019-04-18 DIAGNOSIS — Z23 Encounter for immunization: Secondary | ICD-10-CM | POA: Insufficient documentation

## 2019-04-18 NOTE — Progress Notes (Signed)
   Covid-19 Vaccination Clinic  Name:  Ulus Hazen    MRN: [id number] DOB: 11-08-1940  04/18/2019  Mr. Lemmons was observed post Covid-19 immunization for 15 minutes without incident. He was provided with Vaccine Information Sheet and instruction to access the V-Safe system.   Mr. Brizzi was instructed to call 911 with any severe reactions post vaccine: Marland Kitchen Difficulty breathing  . Swelling of face and throat  . A fast heartbeat  . A bad rash all over body  . Dizziness and weakness   Immunizations Administered    Name Date Dose VIS Date Route   Pfizer COVID-19 Vaccine 04/18/2019  2:08 PM 0.3 mL 01/21/2019 Intramuscular   Manufacturer: [employer], [employer]   Lot: [id number]   NDC: [address]-0

## 2019-05-05 DIAGNOSIS — E1169 Type 2 diabetes mellitus with other specified complication: Secondary | ICD-10-CM | POA: Insufficient documentation

## 2019-05-05 DIAGNOSIS — N400 Enlarged prostate without lower urinary tract symptoms: Secondary | ICD-10-CM | POA: Insufficient documentation

## 2019-06-06 ENCOUNTER — Ambulatory Visit: Payer: Medicare HMO | Admitting: Podiatry

## 2019-06-07 ENCOUNTER — Ambulatory Visit: Payer: Medicare HMO | Admitting: Podiatry

## 2019-06-30 ENCOUNTER — Other Ambulatory Visit: Payer: Self-pay

## 2019-06-30 ENCOUNTER — Ambulatory Visit (INDEPENDENT_AMBULATORY_CARE_PROVIDER_SITE_OTHER): Payer: Medicare HMO | Admitting: Podiatry

## 2019-06-30 DIAGNOSIS — E119 Type 2 diabetes mellitus without complications: Secondary | ICD-10-CM

## 2019-06-30 DIAGNOSIS — M79674 Pain in right toe(s): Secondary | ICD-10-CM | POA: Diagnosis not present

## 2019-06-30 DIAGNOSIS — M79675 Pain in left toe(s): Secondary | ICD-10-CM | POA: Diagnosis not present

## 2019-06-30 DIAGNOSIS — B351 Tinea unguium: Secondary | ICD-10-CM | POA: Diagnosis not present

## 2019-07-02 NOTE — Progress Notes (Signed)
Subjective: 79 y.o. returns the office today for painful, elongated, thickened toenails which he cannot trim himself.  When he tries to trim them himself it causes bleeding.  Denies any redness or drainage around the nails. Denies any acute changes since last appointment and no new complaints today. Denies any systemic complaints such as fevers, chills, nausea, vomiting.   PCP: [name], MD  Objective: AAO 3, NAD DP/PT pulses palpable, CRT less than 3 seconds Nails hypertrophic, dystrophic, elongated, brittle, discolored 10. There is tenderness overlying the nails 1-5 bilaterally. There is no surrounding erythema or drainage along the nail sites. No open lesions or pre-ulcerative lesions are identified. No other areas of tenderness bilateral lower extremities. No overlying edema, erythema, increased warmth. No pain with calf compression, swelling, warmth, erythema.  Assessment: Patient presents with symptomatic onychomycosis  Plan: -Treatment options including alternatives, risks, complications were discussed -Nails sharply debrided 10 without complication/bleeding. -Discussed daily foot inspection. If there are any changes, to call the office immediately.  -Follow-up in 3 months or sooner if any problems are to arise. In the meantime, encouraged to call the office with any questions, concerns, changes symptoms.  Ovid Curd, DPM

## 2019-10-03 ENCOUNTER — Encounter: Payer: Self-pay | Admitting: Podiatry

## 2019-10-03 ENCOUNTER — Ambulatory Visit (INDEPENDENT_AMBULATORY_CARE_PROVIDER_SITE_OTHER): Payer: Medicare HMO | Admitting: Podiatry

## 2019-10-03 ENCOUNTER — Other Ambulatory Visit: Payer: Self-pay

## 2019-10-03 DIAGNOSIS — M79675 Pain in left toe(s): Secondary | ICD-10-CM

## 2019-10-03 DIAGNOSIS — M79674 Pain in right toe(s): Secondary | ICD-10-CM

## 2019-10-03 DIAGNOSIS — E119 Type 2 diabetes mellitus without complications: Secondary | ICD-10-CM

## 2019-10-03 DIAGNOSIS — B351 Tinea unguium: Secondary | ICD-10-CM

## 2019-10-03 NOTE — Progress Notes (Signed)
Subjective: 79 y.o. returns the office today for painful, elongated, thickened toenails which he cannot trim himself. Denies any acute changes since last appointment and no new complaints today. Denies any systemic complaints such as fevers, chills, nausea, vomiting.   PCP: [name], MD  Objective: AAO 3, NAD DP/PT pulses palpable, CRT less than 3 seconds Nails hypertrophic, dystrophic, elongated, brittle, discolored 10. There is tenderness overlying the nails 1-5 bilaterally. There is no surrounding erythema or drainage along the nail sites. No open lesions or pre-ulcerative lesions are identified. No other areas of tenderness bilateral lower extremities. No overlying edema, erythema, increased warmth. No pain with calf compression, swelling, warmth, erythema.  Assessment: Patient presents with symptomatic onychomycosis  Plan: -Treatment options including alternatives, risks, complications were discussed -Nails sharply debrided 10 without complication/bleeding. -Discussed daily foot inspection. If there are any changes, to call the office immediately.  -Follow-up in 9 weeks or sooner if any problems are to arise. In the meantime, encouraged to call the office with any questions, concerns, changes symptoms.  Ovid Curd, DPM

## 2019-11-07 ENCOUNTER — Ambulatory Visit (INDEPENDENT_AMBULATORY_CARE_PROVIDER_SITE_OTHER)
Admission: RE | Admit: 2019-11-07 | Discharge: 2019-11-07 | Disposition: A | Discharge status: Home / Self Care | Payer: Medicare HMO | Source: Ambulatory Visit | Attending: Acute Care | Admitting: Acute Care

## 2019-11-07 ENCOUNTER — Other Ambulatory Visit: Payer: Self-pay

## 2019-11-07 DIAGNOSIS — Z87891 Personal history of nicotine dependence: Secondary | ICD-10-CM | POA: Diagnosis not present

## 2019-11-07 DIAGNOSIS — Z122 Encounter for screening for malignant neoplasm of respiratory organs: Secondary | ICD-10-CM

## 2019-11-07 DIAGNOSIS — F17210 Nicotine dependence, cigarettes, uncomplicated: Secondary | ICD-10-CM

## 2019-11-10 ENCOUNTER — Encounter: Payer: Self-pay | Admitting: Primary Care

## 2019-11-10 ENCOUNTER — Ambulatory Visit (INDEPENDENT_AMBULATORY_CARE_PROVIDER_SITE_OTHER): Payer: Medicare HMO | Admitting: Primary Care

## 2019-11-10 ENCOUNTER — Other Ambulatory Visit: Payer: Self-pay

## 2019-11-10 DIAGNOSIS — G4733 Obstructive sleep apnea (adult) (pediatric): Secondary | ICD-10-CM

## 2019-11-10 DIAGNOSIS — I700 Atherosclerosis of aorta: Secondary | ICD-10-CM

## 2019-11-10 DIAGNOSIS — J432 Centrilobular emphysema: Secondary | ICD-10-CM

## 2019-11-10 DIAGNOSIS — Z720 Tobacco use: Secondary | ICD-10-CM

## 2019-11-10 NOTE — Assessment & Plan Note (Signed)
-   LD CT chest on 11/07/2019 that showed lung RADS 2, right upper lobe pulmonary nodule 2.6 mm which is not significantly changed. Continue annual screening.  - Smoking cessation strongly encourage, patient was given resources to help quit.

## 2019-11-10 NOTE — Assessment & Plan Note (Signed)
-   Patient continues to be moderately compliant with CPAP therapy and reports benefit from use. - Encourage patient to aim for 70% compliance, wear 4-6 hours or more - Current pressure setting 5-15cm h20, AHI 1.9 - No changes today. FU in 6 months.

## 2019-11-10 NOTE — Patient Instructions (Addendum)
CT chest showed small right upper lobe pulmonary nodules 2.17mm, no significant change  Recommendations: - Continue with Low dose CT scan if able (I will check with our coordinator. Medicare stops coverage at age 79, private insurance covers until 68) - Continue to wear CPAP every night for 4 to 6 hours or more (aim for 70% compliance) - Do not drive if experiencing excessive daytime fatigue or somnolence - No changes today  - Encourage you quit smoking, taper amount and use nicotine replacement therapy   Follow-up: - 6 months Dr. Vassie Loll or APP    CPAP and BPAP Information CPAP and BPAP are methods of helping a person breathe with the use of air pressure. CPAP stands for "continuous positive airway pressure." BPAP stands for "bi-level positive airway pressure." In both methods, air is blown through your nose or mouth and into your air passages to help you breathe well. CPAP and BPAP use different amounts of pressure to blow air. With CPAP, the amount of pressure stays the same while you breathe in and out. With BPAP, the amount of pressure is increased when you breathe in (inhale) so that you can take larger breaths. Your health care provider will recommend whether CPAP or BPAP would be more helpful for you. Why are CPAP and BPAP treatments used? CPAP or BPAP can be helpful if you have:  Sleep apnea.  Chronic obstructive pulmonary disease (COPD).  Heart failure.  Medical conditions that weaken the muscles of the chest including muscular dystrophy, or neurological diseases such as amyotrophic lateral sclerosis (ALS).  Other problems that cause breathing to be weak, abnormal, or difficult. CPAP is most commonly used for obstructive sleep apnea (OSA) to keep the airways from collapsing when the muscles relax during sleep. How is CPAP or BPAP administered? Both CPAP and BPAP are provided by a small machine with a flexible plastic tube that attaches to a plastic mask. You wear the mask. Air is  blown through the mask into your nose or mouth. The amount of pressure that is used to blow the air can be adjusted on the machine. Your health care provider will determine the pressure setting that should be used based on your individual needs. When should CPAP or BPAP be used? In most cases, the mask only needs to be worn during sleep. Generally, the mask needs to be worn throughout the night and during any daytime naps. People with certain medical conditions may also need to wear the mask at other times when they are awake. Follow instructions from your health care provider about when to use the machine. What are some tips for using the mask?   Because the mask needs to be snug, some people feel trapped or closed-in (claustrophobic) when first using the mask. If you feel this way, you may need to get used to the mask. One way to do this is by holding the mask loosely over your nose or mouth and then gradually applying the mask more snugly. You can also gradually increase the amount of time that you use the mask.  Masks are available in various types and sizes. Some fit over your mouth and nose while others fit over just your nose. If your mask does not fit well, talk with your health care provider about getting a different one.  If you are using a mask that fits over your nose and you tend to breathe through your mouth, a chin strap may be applied to help keep your mouth closed.  The CPAP and BPAP machines have alarms that may sound if the mask comes off or develops a leak.  If you have trouble with the mask, it is very important that you talk with your health care provider about finding a way to make the mask easier to tolerate. Do not stop using the mask. Stopping the use of the mask could have a negative impact on your health. What are some tips for using the machine?  Place your CPAP or BPAP machine on a secure table or stand near an electrical outlet.  Know where the on/off switch is  located on the machine.  Follow instructions from your health care provider about how to set the pressure on your machine and when you should use it.  Do not eat or drink while the CPAP or BPAP machine is on. Food or fluids could get pushed into your lungs by the pressure of the CPAP or BPAP.  Do not smoke. Tobacco smoke residue can damage the machine.  For home use, CPAP and BPAP machines can be rented or purchased through home health care companies. Many different brands of machines are available. Renting a machine before purchasing may help you find out which particular machine works well for you.  Keep the CPAP or BPAP machine and attachments clean. Ask your health care provider for specific instructions. Get help right away if:  You have redness or open areas around your nose or mouth where the mask fits.  You have trouble using the CPAP or BPAP machine.  You cannot tolerate wearing the CPAP or BPAP mask.  You have pain, discomfort, and bloating in your abdomen. Summary  CPAP and BPAP are methods of helping a person breathe with the use of air pressure.  Both CPAP and BPAP are provided by a small machine with a flexible plastic tube that attaches to a plastic mask.  If you have trouble with the mask, it is very important that you talk with your health care provider about finding a way to make the mask easier to tolerate. This information is not intended to replace advice given to you by your health care provider. Make sure you discuss any questions you have with your health care provider. Document Revised: 05/19/2018 Document Reviewed: 12/17/2015 Elsevier Patient Education  2020 [employer].

## 2019-11-10 NOTE — Assessment & Plan Note (Addendum)
Continue pravastatin 80 mg daily.  

## 2019-11-10 NOTE — Assessment & Plan Note (Addendum)
-   Mild centrilobular and paraseptal emphysema on CT imaging 11/07/19 - No respiratory symptoms.  - No PFTs on file

## 2019-11-10 NOTE — Progress Notes (Signed)
@Patient  ID: , male    DOB: April 06, 1940, 79 y.o.   MRN: [id number]  Chief Complaint  Patient presents with  . Follow-up    6 month follow up and results of CT scan.      Referring provider: [id number], MD  HPI: 79 year old MALE, current smoker. PMH significant for OSA, tobacco abuse. Patient of Dr. 70. HST 08/2017 showed moderate OSA with 18/h. Maintained on auto CPAP 5-15cm h20. Following with low-dose lung cancer screening program. LDCT 10/29/18 >> Lung- RADS 2 (08/2017 - RADS 1S).   Previous [facility] pulmonary encounter: 09/07/2018 Patient presents today for OSA 6 month follow-up. He is doing well, no complaints. Moderate compliance with CPAP. States that he forgets to put mask on a few times because he falls asleep on the couch. He reports sleeping well at night with less snoring. Feels well rested when he wakes up in the morning.   Airview download:  14/30 days (47%); 14 days >4 hours.  Pressure 5-15cm H20 (12.2) AHI 2.2   03/15/2019 Patient presents today for 6 month OSA follow-up. He is doing well, no acute complaints. He is mostly compliant with CPAP. States that he has had early doctors appointment and had recent family member pass away. No issues with machine, mask fit or pressure setting. Sleeping well through the night with less snoring. Takes naps during the day but does not use CPAP then. No trouble breathing. He is still smoking 0.75 ppd, he would like to quit and states that the 05/13/2019 gave him nicotine patches. His wife is his support system. Denies shortness of breath, chest tightness or wheezing.   Airview download 02/11/19-03/12/19: Usage 12/30 days; 12 days > 4 hours Average usage days used- 6 hours 14 mins Pressure 5-15cm h20 (14.5- 95%) Leaks 28.9L/min AHI 3.2   11/10/2019-Interim history Patient presents today for 38-month follow-up CT imaging and OSA. Patient is current smoker, he is apart of our lung cancer screening program. He had annual low-dose CT chest on  11/07/2019 that showed lung RADS 2, right upper lobe pulmonary nodule 2.6 mm which is not significantly changed. Discussed smoking cessation with him today.   In regards to his sleep apnea he remains moderately compliant with CPAP. No issues with pressure setting or mask fit. He is sleeping well, gets approx 7 hours at night. He tends to not wear his CPAP on days when he needs to bring his wife to doctors apt the following day. States that he sleeps so well with CPAP he is worried about over sleeping or not waking up at night if she needs him. He drives his wife on Wednesday to clinic in [place] for wound check/dressing change lower on her lower extremity.   Airview download 10/10/19-11/08/19: Usage days 17/30 (57%) Average usage 5 hours 46 mins  Pressure 5-15cm h20 (13.4cm h20-95%) Leaks 21.5L/min (95%) AHI 1.9    Imaging: LDCT 10/29/18 >> Lung- RADS 2 (08/2017 - RADS 1S).  LDCT 11/07/19 >> Lung - RADS 2, benign appearance or behavior   No Known Allergies  Immunization History  Administered Date(s) Administered  . Fluad Quad(high Dose 65+) 11/11/2018  . Influenza, High Dose Seasonal PF 11/02/2015, 01/10/2017  . Influenza,inj,Quad PF,6+ Mos 11/10/2017  . Influenza-Unspecified 11/10/2017  . PFIZER SARS-COV-2 Vaccination 03/26/2019, 04/18/2019  . Pneumococcal Conjugate-13 10/10/2016  . Pneumococcal Polysaccharide-23 01/10/2015    Past Medical History:  Diagnosis Date  . Diabetes mellitus without complication (HCC)   . Hyperlipidemia   . Hypertension  Tobacco History: Social History   Tobacco Use  Smoking Status Current Every Day Smoker  . Packs/day: 0.75  . Years: 62.00  . Pack years: 46.50  . Types: Cigarettes  Smokeless Tobacco Never Used  Tobacco Comment   1/2 per day    Ready to quit: Not Answered Counseling given: Not Answered Comment: 1/2 per day    Outpatient Medications Prior to Visit  Medication Sig Dispense Refill  . D3-50 1.25 MG (50000 UT) capsule  Take 50,000 Units by mouth as directed.    . dorzolamide (TRUSOPT) 2 % ophthalmic solution     . metFORMIN (GLUCOPHAGE) 1000 MG tablet Take 1,000 mg by mouth 2 (two) times daily with a meal.    . pravastatin (PRAVACHOL) 80 MG tablet Take 80 mg by mouth daily.    . tamsulosin (FLOMAX) 0.4 MG CAPS capsule Take 0.4 mg by mouth daily after supper.     . valsartan (DIOVAN) 160 MG tablet Take 160 mg by mouth daily.     Marland Kitchen losartan (COZAAR) 50 MG tablet Take 50 mg by mouth daily.     No facility-administered medications prior to visit.   Review of Systems  Review of Systems  Constitutional: Negative.  Negative for fatigue.  HENT: Negative.   Respiratory: Negative.   Psychiatric/Behavioral: Negative.    Physical Exam  BP 130/62   Pulse 80   Temp (!) 96.1 F (35.6 C) (Oral)   Ht 6\' 2"  (1.88 m)   Wt 222 lb (100.7 kg)   SpO2 99%   BMI 28.50 kg/m  Physical Exam Constitutional:      General: He is not in acute distress.    Appearance: Normal appearance. He is not ill-appearing.  HENT:     Head: Normocephalic and atraumatic.     Mouth/Throat:     Comments: Deferred d/t masking Cardiovascular:     Rate and Rhythm: Normal rate and regular rhythm.  Pulmonary:     Effort: Pulmonary effort is normal.     Breath sounds: Normal breath sounds. No wheezing or rhonchi.     Comments: CTA Musculoskeletal:        General: Normal range of motion.  Skin:    General: Skin is warm and dry.  Neurological:     General: No focal deficit present.     Mental Status: He is alert and oriented to person, place, and time. Mental status is at baseline.  Psychiatric:        Mood and Affect: Mood normal.        Behavior: Behavior normal.        Thought Content: Thought content normal.        Judgment: Judgment normal.      Lab Results:  CBC No results found for: WBC, RBC, HGB, HCT, PLT, MCV, MCH, MCHC, RDW, LYMPHSABS, MONOABS, EOSABS, BASOSABS  BMET No results found for: NA, K, CL, CO2, GLUCOSE,  BUN, CREATININE, CALCIUM, GFRNONAA, GFRAA  BNP No results found for: BNP  ProBNP No results found for: PROBNP  Imaging: CT CHEST LUNG CA SCREEN LOW DOSE W/O CM  Result Date: 11/07/2019 CLINICAL DATA:  Forty-eight pack-year smoking history. Current smoker. EXAM: CT CHEST WITHOUT CONTRAST LOW-DOSE FOR LUNG CANCER SCREENING TECHNIQUE: Multidetector CT imaging of the chest was performed following the standard protocol without IV contrast. COMPARISON:  10/21/2018 FINDINGS: Cardiovascular: Aortic atherosclerosis. Tortuous thoracic aorta. Normal heart size, without pericardial effusion. Multivessel coronary artery atherosclerosis. Mediastinum/Nodes: No mediastinal or definite hilar adenopathy, given limitations of unenhanced  CT. Lungs/Pleura: No pleural fluid. Mild centrilobular and paraseptal emphysema. Left lower lobe calcified granuloma. A right upper lobe pulmonary nodule of volume derived equivalent diameter 2.6 mm is not significantly changed. Upper Abdomen: Normal imaged portions of the liver, spleen, stomach, pancreas, adrenal glands, kidneys. Musculoskeletal: Upper and midthoracic spondylosis. IMPRESSION: 1. Lung-RADS 2, benign appearance or behavior. Continue annual screening with low-dose chest CT without contrast in 12 months. 2. Aortic Atherosclerosis (ICD10-I70.0) and Emphysema (ICD10-J43.9). Coronary artery atherosclerosis. Electronically Signed   By: Jeronimo Greaves M.D.   On: 11/07/2019 16:10     Assessment & Plan:   OSA (obstructive sleep apnea) - Patient continues to be moderately compliant with CPAP therapy and reports benefit from use. - Encourage patient to aim for 70% compliance, wear 4-6 hours or more - Current pressure setting 5-15cm h20, AHI 1.9 - No changes today. FU in 6 months.   Tobacco abuse - LD CT chest on 11/07/2019 that showed lung RADS 2, right upper lobe pulmonary nodule 2.6 mm which is not significantly changed. Continue annual screening.  - Smoking cessation  strongly encourage, patient was given resources to help quit.   Aortic atherosclerosis (HCC) - Continue pravastatin 80mg  daily    Centrilobular emphysema (HCC) - Mild centrilobular and paraseptal emphysema on CT imaging 11/07/19 - No respiratory symptoms.  - No PFTs on file    11/09/19, NP 11/10/2019

## 2019-11-11 ENCOUNTER — Other Ambulatory Visit: Payer: Self-pay | Admitting: *Deleted

## 2019-11-11 DIAGNOSIS — Z87891 Personal history of nicotine dependence: Secondary | ICD-10-CM

## 2019-11-11 DIAGNOSIS — F17210 Nicotine dependence, cigarettes, uncomplicated: Secondary | ICD-10-CM

## 2019-12-13 ENCOUNTER — Ambulatory Visit (INDEPENDENT_AMBULATORY_CARE_PROVIDER_SITE_OTHER): Payer: Medicare HMO | Admitting: Podiatry

## 2019-12-13 ENCOUNTER — Other Ambulatory Visit: Payer: Self-pay

## 2019-12-13 DIAGNOSIS — B351 Tinea unguium: Secondary | ICD-10-CM | POA: Diagnosis not present

## 2019-12-13 DIAGNOSIS — E119 Type 2 diabetes mellitus without complications: Secondary | ICD-10-CM

## 2019-12-13 DIAGNOSIS — M79675 Pain in left toe(s): Secondary | ICD-10-CM | POA: Diagnosis not present

## 2019-12-13 DIAGNOSIS — M79674 Pain in right toe(s): Secondary | ICD-10-CM

## 2019-12-14 NOTE — Progress Notes (Signed)
Subjective: 79 y.o. returns the office today for painful, elongated, thickened toenails which he cannot trim himself. No new complaints today. Denies any systemic complaints such as fevers, chills, nausea, vomiting.   PCP: [name], MD  Objective: AAO 3, NAD DP/PT pulses palpable, CRT less than 3 seconds Nails hypertrophic, dystrophic, elongated, brittle, discolored 10. There is tenderness overlying the nails 1-5 bilaterally. There is no surrounding erythema or drainage along the nail sites. No open lesions or pre-ulcerative lesions are identified. No other areas of tenderness bilateral lower extremities. No overlying edema, erythema, increased warmth. No pain with calf compression, swelling, warmth, erythema.  Assessment: Patient presents with symptomatic onychomycosis  Plan: -Treatment options including alternatives, risks, complications were discussed -Nails sharply debrided 10 without complication/bleeding. -Discussed daily foot inspection. If there are any changes, to call the office immediately.  -Follow-up in 9 weeks or sooner if any problems are to arise. In the meantime, encouraged to call the office with any questions, concerns, changes symptoms.  Ovid Curd, DPM

## 2020-03-15 ENCOUNTER — Ambulatory Visit (INDEPENDENT_AMBULATORY_CARE_PROVIDER_SITE_OTHER): Payer: Medicare HMO | Admitting: Podiatry

## 2020-03-15 ENCOUNTER — Encounter: Payer: Self-pay | Admitting: Podiatry

## 2020-03-15 ENCOUNTER — Other Ambulatory Visit: Payer: Self-pay

## 2020-03-15 DIAGNOSIS — B351 Tinea unguium: Secondary | ICD-10-CM

## 2020-03-15 DIAGNOSIS — M79675 Pain in left toe(s): Secondary | ICD-10-CM | POA: Diagnosis not present

## 2020-03-15 DIAGNOSIS — M79674 Pain in right toe(s): Secondary | ICD-10-CM

## 2020-03-15 DIAGNOSIS — E119 Type 2 diabetes mellitus without complications: Secondary | ICD-10-CM | POA: Diagnosis not present

## 2020-03-18 NOTE — Progress Notes (Signed)
Subjective: 80 y.o. returns the office today for painful, elongated, thickened toenails which he cannot trim himself. No new complaints today. Denies any systemic complaints such as fevers, chills, nausea, vomiting.   PCP: [name], MD  Objective: AAO 3, NAD DP/PT pulses palpable, CRT less than 3 seconds Nails hypertrophic, dystrophic, elongated, brittle, discolored 10. There is tenderness overlying the nails 1-5 bilaterally. There is no surrounding erythema or drainage along the nail sites. No open lesions or pre-ulcerative lesions are identified. No pain with calf compression, swelling, warmth, erythema.  Assessment: Patient presents with symptomatic onychomycosis  Plan: -Treatment options including alternatives, risks, complications were discussed -Nails sharply debrided 10 without complication/bleeding. -Discussed daily foot inspection. If there are any changes, to call the office immediately.  -Follow-up in 9 weeks or sooner if any problems are to arise. In the meantime, encouraged to call the office with any questions, concerns, changes symptoms.  Ovid Curd, DPM

## 2020-05-28 ENCOUNTER — Other Ambulatory Visit: Payer: Self-pay

## 2020-05-28 ENCOUNTER — Ambulatory Visit (INDEPENDENT_AMBULATORY_CARE_PROVIDER_SITE_OTHER): Payer: Medicare HMO | Admitting: Podiatry

## 2020-05-28 DIAGNOSIS — M79675 Pain in left toe(s): Secondary | ICD-10-CM | POA: Diagnosis not present

## 2020-05-28 DIAGNOSIS — M79674 Pain in right toe(s): Secondary | ICD-10-CM | POA: Diagnosis not present

## 2020-05-28 DIAGNOSIS — E119 Type 2 diabetes mellitus without complications: Secondary | ICD-10-CM

## 2020-05-28 DIAGNOSIS — B351 Tinea unguium: Secondary | ICD-10-CM

## 2020-05-30 NOTE — Progress Notes (Signed)
Subjective: 80 y.o. returns the office today for painful, elongated, thickened toenails which he cannot trim himself. No new complaints today. Denies any systemic complaints such as fevers, chills, nausea, vomiting.   PCP: [name], MD  Objective: AAO 3, NAD DP/PT pulses palpable, CRT less than 3 seconds Nails hypertrophic, dystrophic, elongated, brittle, discolored 10. There is tenderness overlying the nails 1-5 bilaterally. There is no surrounding erythema or drainage along the nail sites. No open lesions or pre-ulcerative lesions are identified. No pain with calf compression, swelling, warmth, erythema.  Assessment: Patient presents with symptomatic onychomycosis  Plan: -Treatment options including alternatives, risks, complications were discussed -Nails sharply debrided 10 without complication/bleeding. -Discussed daily foot inspection. If there are any changes, to call the office immediately.  -Follow-up in 9 weeks or sooner if any problems are to arise. In the meantime, encouraged to call the office with any questions, concerns, changes symptoms.  Ovid Curd, DPM

## 2020-08-06 ENCOUNTER — Ambulatory Visit: Payer: Medicare HMO | Admitting: Podiatry

## 2020-08-20 ENCOUNTER — Ambulatory Visit (INDEPENDENT_AMBULATORY_CARE_PROVIDER_SITE_OTHER): Payer: Medicare HMO | Admitting: Podiatry

## 2020-08-20 ENCOUNTER — Other Ambulatory Visit: Payer: Self-pay

## 2020-08-20 DIAGNOSIS — B351 Tinea unguium: Secondary | ICD-10-CM

## 2020-08-20 DIAGNOSIS — M79675 Pain in left toe(s): Secondary | ICD-10-CM | POA: Diagnosis not present

## 2020-08-20 DIAGNOSIS — M79674 Pain in right toe(s): Secondary | ICD-10-CM

## 2020-08-20 DIAGNOSIS — E119 Type 2 diabetes mellitus without complications: Secondary | ICD-10-CM | POA: Diagnosis not present

## 2020-08-22 NOTE — Progress Notes (Signed)
Subjective: 80 y.o. returns the office today for painful, elongated, thickened toenails which he cannot trim himself. No new complaints today. Denies any systemic complaints such as fevers, chills, nausea, vomiting.   PCP: [name], MD  Objective: AAO 3, NAD DP/PT pulses palpable, CRT less than 3 seconds Nails hypertrophic, dystrophic, elongated, brittle, discolored 10. There is tenderness overlying the nails 1-5 bilaterally. There is no surrounding erythema or drainage along the nail sites. No open lesions No pain with calf compression, swelling, warmth, erythema.  Assessment: Patient presents with symptomatic onychomycosis  Plan: -Treatment options including alternatives, risks, complications were discussed -Nails sharply debrided 10 without complication/bleeding. -Discussed daily foot inspection. -Follow-up in 9 weeks or sooner if any problems are to arise. In the meantime, encouraged to call the office with any questions, concerns, changes symptoms.  Ovid Curd, DPM

## 2020-10-08 ENCOUNTER — Ambulatory Visit (INDEPENDENT_AMBULATORY_CARE_PROVIDER_SITE_OTHER): Payer: Medicare HMO

## 2020-10-08 ENCOUNTER — Encounter: Payer: Self-pay | Admitting: Podiatrist

## 2020-10-08 ENCOUNTER — Other Ambulatory Visit: Payer: Self-pay

## 2020-10-08 ENCOUNTER — Ambulatory Visit (INDEPENDENT_AMBULATORY_CARE_PROVIDER_SITE_OTHER): Payer: Medicare HMO | Admitting: Podiatrist

## 2020-10-08 DIAGNOSIS — M79671 Pain in right foot: Secondary | ICD-10-CM | POA: Diagnosis not present

## 2020-10-08 DIAGNOSIS — E785 Hyperlipidemia, unspecified: Secondary | ICD-10-CM | POA: Insufficient documentation

## 2020-10-08 DIAGNOSIS — S90821A Blister (nonthermal), right foot, initial encounter: Secondary | ICD-10-CM

## 2020-10-08 DIAGNOSIS — Z719 Counseling, unspecified: Secondary | ICD-10-CM | POA: Insufficient documentation

## 2020-10-08 DIAGNOSIS — E119 Type 2 diabetes mellitus without complications: Secondary | ICD-10-CM | POA: Insufficient documentation

## 2020-10-08 NOTE — Patient Instructions (Signed)
Apply antibiotic ointment to the blister area and cover with a large bandaid  You can also soak in epsom salt water to help dry out the blister  If you see any redness, swelling, or pus coming from the blister, call us to be seen asap  Blisters, Adult  A blister is a raised bubble of skin filled with liquid. Blisters often develop on skin that rubs or presses against another surface repeatedly (friction blister). Friction blisters can occur on any part of the body, but they usually form on the hands or the feet. Long-term pressure on that same area of skin can also cause the area of skin to become hardened. This hardened skin is called acallus. What are the causes? Besides friction, blisters can be caused by: An injury, such as a burn. An allergic reaction. An infection. Exposure to irritating chemicals. Friction blisters often occur in areas with a lot of heat and moisture. Friction blisters often result from: Sports. Repetitive activities. Using tools and doing other activities without wearing gloves. Shoes that are too tight or too loose. What are the signs or symptoms? A blister is often round and looks like a bump. It may hurt or feel itchy. Before a blister forms, your skin may: Turn red. Feel warm. Itch. Be painful to the touch. How is this diagnosed? A blister is diagnosed with a physical exam. How is this treated? Treatment usually involves protecting the area where the blister has formed until your skin has healed. Treatments may include: Using a bandage (dressing) to cover your blister. Putting extra padding around and over your blister so that the blister does not rub on anything. Applying antibiotic ointment. Most blisters break open, dry up, and go away on their own within 1-2 weeks. Blisters that are very painful may be drained before they break open on their own. If your blister is large or painful, your health care provider can drainit. Follow these instructions at  home: Medicines Take or apply over-the-counter and prescription medicines only as told by your health care provider. If you were prescribed an antibiotic medicine, take or apply it as told by your health care provider. Do not stop using the antibiotic even if you start to feel better. Skin care Do not pop your blister. This can cause infection. Keep your blister clean and dry. This helps to prevent infection. Before you swim or use a hot tub, cover your blister with a waterproof dressing. Protect the area where your blister has formed as told by your health care provider. Follow instructions from your health care provider about how to take care of your blister. Make sure you: Wash your hands with soap and water for at least 20 seconds before and after you change your dressing. If soap and water are not available, use hand sanitizer. Change your dressing as told by your health care provider. Infection signs Check your blister every day for signs of infection. Check for: More redness, swelling, or pain. More fluid or blood. Warmth. Pus or a bad smell. General instructions If you have a blister on a foot or toe, wear different shoes until your blister heals. Avoid the activity that caused the blister until your blister heals. Keep all follow-up visits as told by your health care provider. This is important. How is this prevented? Taking these steps can help to prevent blisters that are caused by friction. Make sure you: Wear comfortable shoes that fit well. Always wear socks with shoes. Wear extra socks or use  tape, dressings, or pads over blister-prone areas as needed. You may also apply petroleum jelly under dressings in blister-prone areas. Wear protective gear, such as gloves, when taking part in sports or activities that can cause blisters. Wear loose-fitting, moisture-wicking clothes when taking part in sports or activities. Use powders as needed to keep your feet dry. Contact a  health care provider if: You have more redness, swelling, or pain around your blister. You have more fluid or blood coming from your blister. Your blister feels warm to the touch. You have pus or a bad smell coming from your blister. You have a fever or chills. Your blister gets better and then it gets worse. Summary A blister is a raised bubble of skin filled with liquid. Blisters often develop on skin that rubs or presses against another surface repeatedly (friction blister). Most blisters break open, dry up, and go away on their own within 1-2 weeks. Keep your blister clean and dry. This helps to prevent infection. Take steps to help prevent blisters that are caused by friction. Contact a health care provider if you have signs of infection. This information is not intended to replace advice given to you by your health care provider. Make sure you discuss any questions you have with your healthcare provider. Document Revised: 12/16/2018 Document Reviewed: 12/16/2018 Elsevier Patient Education  2022 [employer].

## 2020-10-08 NOTE — Progress Notes (Signed)
  Chief Complaint  Patient presents with   Blister    Blister at Rt 5th sub met below plantar forefoot x couple wks - no injury - little sore; 8/10 constant pain per pt - w/ itching - no redness - per pt some drainage came out ( white colored) and its smaller in size - no N/V/F/Ch -Tx: none    Diabetes    FBS: dont check A1C: 7.1     HPI: Patient is 80 y.o. male who presents today for the concerns as listed above.   Patient Active Problem List   Diagnosis Date Noted   Type 2 diabetes mellitus ([place]) 10/08/2020   Hyperlipidemia 10/08/2020   Counseling, unspecified 10/08/2020   Aortic atherosclerosis ([place]) 11/10/2019   Centrilobular emphysema ([place]) 11/10/2019   Benign prostatic hyperplasia without lower urinary tract symptoms 05/05/2019   Hyperlipidemia associated with type 2 diabetes mellitus ([place]) 05/05/2019   OSA (obstructive sleep apnea) 08/07/2017   Tobacco abuse 08/07/2017   Hypertension 01/10/2015     No Known Allergies  Review of Systems No fevers, chills, nausea, muscle aches, no difficulty breathing, no calf pain, no chest pain or shortness of breath.   Physical Exam  GENERAL APPEARANCE: Alert, conversant. Appropriately groomed. No acute distress.   VASCULAR: Pedal pulses palpable 2/4 DP and PT bilateral.  Capillary refill time is immediate to all digits,  Proximal to distal cooling it warm to warm.  Digital perfusion adequate.   NEUROLOGIC: sensation is intact to 5.07 monofilament at 5/5 sites bilateral.  Light touch is intact bilateral, vibratory sensation intact bilateral  MUSCULOSKELETAL: acceptable muscle strength, tone and stability bilateral.  No gross boney pedal deformities noted.  No pain, crepitus or limitation noted with foot and ankle range of motion bilateral.   DERMATOLOGIC: skin is warm, supple, and dry.  Large intact blister is present on the lateral aspect of the right foot .  It is fluid filled and when drained, a clear yellow fluid is expressed.  No  redness, no swelling, no sign of infection noted.      Xrays taken and evaluated. No acute osseous abnormalities present.  No emphysema within the soft tissues is seen. No changes within the fifth metatarsal concerning for bone involvement.    Assessment   1. Foot pain, right   2. Blister of plantar aspect of foot, right, initial encounter      Plan  Discussed exam and x-ray findings with the patient.  Recommended draining the blister and the patient agreed I prepped the area with Betadine and utilizing a #15 blade made a superficial incision into the blister and drained out the fluid.  I took a small wedge of superficial skin with a tissue nipper in order to allow the area to continue to drain.  Iodosorb and a dry sterile compressive dressing was applied and the patient was instructed on aftercare including Epsom salt soaks use of antibiotic ointment and a fluffy compressive dry sterile compressive dressing.  He will be seen back in 2 weeks for follow-up -if any redness, swelling, pain or sign of infection arises he is instructed to call our office immediately.

## 2020-10-10 ENCOUNTER — Other Ambulatory Visit: Payer: Self-pay | Admitting: Podiatrist

## 2020-10-10 DIAGNOSIS — S90821A Blister (nonthermal), right foot, initial encounter: Secondary | ICD-10-CM

## 2020-10-22 ENCOUNTER — Other Ambulatory Visit: Payer: Self-pay

## 2020-10-22 ENCOUNTER — Ambulatory Visit (INDEPENDENT_AMBULATORY_CARE_PROVIDER_SITE_OTHER): Payer: Medicare HMO | Admitting: Podiatry

## 2020-10-22 ENCOUNTER — Encounter: Payer: Self-pay | Admitting: Podiatry

## 2020-10-22 VITALS — Temp 98.4°F

## 2020-10-22 DIAGNOSIS — E119 Type 2 diabetes mellitus without complications: Secondary | ICD-10-CM | POA: Diagnosis not present

## 2020-10-22 DIAGNOSIS — L84 Corns and callosities: Secondary | ICD-10-CM | POA: Diagnosis not present

## 2020-10-22 DIAGNOSIS — S90821D Blister (nonthermal), right foot, subsequent encounter: Secondary | ICD-10-CM

## 2020-10-24 NOTE — Progress Notes (Signed)
Subjective: 80 year old male presents the office today for follow-up evaluation of blister to the bottom of his right foot.  He was last seen by Dr. Irving Shows when the blister was drained and he has been keeping a small amount of antibiotic ointment on the area daily.  He states he is doing much better.  Not see any drainage and denies any swelling or redness.  He has no fevers or chills.  No other concerns.  Last A1c was 7 on May 29, 2020  Objective: AAO x3, NAD DP/PT pulses palpable bilaterally, CRT less than 3 seconds On the plantar aspect of the right foot fifth metatarsal base with hyperkeratotic tissue.  Upon debridement there is no underlying ulceration drainage or any signs of infection.  Dry skin present on the periphery.  There is no blister formation or any fluid present.  There is no erythema.  No fluctuance or crepitation.  There is no malodor. No open lesions or pre-ulcerative lesions.  No pain with calf compression, swelling, warmth, erythema  Assessment: 80 year old male with resolved blister with preulcerative callus  Plan: -All treatment options discussed with the patient including all alternatives, risks, complications.  -I sharply debrided the hyperkeratotic tissue with any complications or bleeding.  At this point would recommend a small amount of moisturizer as there is no open sore.  Continue offloading monitor for any reoccurrence.  Was not sure how this started and discussed the importance of daily foot inspection.  I will see him back for his regular appointment as scheduled or sooner if any issues are to arise. -Patient encouraged to call the office with any questions, concerns, change in symptoms.   Vivi Barrack DPM

## 2020-11-19 ENCOUNTER — Other Ambulatory Visit: Payer: Self-pay

## 2020-11-19 ENCOUNTER — Ambulatory Visit (INDEPENDENT_AMBULATORY_CARE_PROVIDER_SITE_OTHER): Payer: Medicare HMO | Admitting: Podiatry

## 2020-11-19 DIAGNOSIS — E119 Type 2 diabetes mellitus without complications: Secondary | ICD-10-CM | POA: Diagnosis not present

## 2020-11-19 DIAGNOSIS — M79675 Pain in left toe(s): Secondary | ICD-10-CM

## 2020-11-19 DIAGNOSIS — M79674 Pain in right toe(s): Secondary | ICD-10-CM

## 2020-11-19 DIAGNOSIS — B351 Tinea unguium: Secondary | ICD-10-CM | POA: Diagnosis not present

## 2020-11-21 NOTE — Progress Notes (Signed)
Subjective: 80 y.o. returns the office today for painful, elongated, thickened toenails which he cannot trim himself. No recurrence of the ulcer he notes and no open lesions. His wife told me she thinks there may be a new spot forming on the right foot. Denies any systemic complaints such as fevers, chills, nausea, vomiting.   PCP: [name], MD  Last A1c 7.0 on 05/29/2020  Objective: AAO 3, NAD DP/PT pulses palpable, CRT less than 3 seconds Nails hypertrophic, dystrophic, elongated, brittle, discolored 10. There is tenderness overlying the nails 1-5 bilaterally. There is no surrounding erythema or drainage along the nail sites. No open lesions but there are areas of dry skin, minimal hyperkeratotic tissue 5th metatarsal base on bilateral feet. The area of the previous blister/ulcer has healed.  No pain with calf compression, swelling, warmth, erythema.  Assessment: Patient presents with symptomatic onychomycosis; pre-ulcerative areas bilaterally  Plan: -Treatment options including alternatives, risks, complications were discussed -Nails sharply debrided 10 without complication/bleeding. -Recommend moisturizer daily and offloading. We discussed shoe changes as well to help decrease pressure.  -Discussed daily foot inspection. -Follow-up in 9 weeks or sooner if any problems are to arise. In the meantime, encouraged to call the office with any questions, concerns, changes symptoms.  Ovid Curd, DPM

## 2020-12-18 ENCOUNTER — Other Ambulatory Visit: Payer: Self-pay

## 2020-12-18 DIAGNOSIS — F17210 Nicotine dependence, cigarettes, uncomplicated: Secondary | ICD-10-CM

## 2020-12-18 DIAGNOSIS — Z87891 Personal history of nicotine dependence: Secondary | ICD-10-CM

## 2020-12-28 ENCOUNTER — Other Ambulatory Visit: Payer: Self-pay

## 2020-12-28 ENCOUNTER — Ambulatory Visit (INDEPENDENT_AMBULATORY_CARE_PROVIDER_SITE_OTHER)
Admission: RE | Admit: 2020-12-28 | Discharge: 2020-12-28 | Disposition: A | Discharge status: Home / Self Care | Payer: Medicare HMO | Source: Ambulatory Visit | Attending: Acute Care | Admitting: Acute Care

## 2020-12-28 DIAGNOSIS — Z87891 Personal history of nicotine dependence: Secondary | ICD-10-CM | POA: Diagnosis not present

## 2020-12-28 DIAGNOSIS — F17210 Nicotine dependence, cigarettes, uncomplicated: Secondary | ICD-10-CM

## 2020-12-31 ENCOUNTER — Other Ambulatory Visit: Payer: Self-pay | Admitting: Acute Care

## 2020-12-31 DIAGNOSIS — F17210 Nicotine dependence, cigarettes, uncomplicated: Secondary | ICD-10-CM

## 2020-12-31 DIAGNOSIS — Z87891 Personal history of nicotine dependence: Secondary | ICD-10-CM

## 2021-01-10 ENCOUNTER — Telehealth: Payer: Self-pay | Admitting: Primary Care

## 2021-01-11 NOTE — Telephone Encounter (Signed)
ATC patient about message. Advised them to call the office back to set up an appt since it has been over a year since last OV. And in last AVS on 11/10/2019 it says to return in 6 months.   Instructions  CT chest showed small right upper lobe pulmonary nodules 2.28mm, no significant change   Recommendations: - Continue with Low dose CT scan if able (I will check with our coordinator. Medicare stops coverage at age 80, private insurance covers until 61) - Continue to wear CPAP every night for 4 to 6 hours or more (aim for 70% compliance) - Do not drive if experiencing excessive daytime fatigue or somnolence - No changes today  - Encourage you quit smoking, taper amount and use nicotine replacement therapy    Follow-up: - 6 months Dr. Vassie Loll or APP     When patient calls back please schedule appt with Dr. Vassie Loll or APP

## 2021-02-18 ENCOUNTER — Other Ambulatory Visit: Payer: Self-pay

## 2021-02-18 ENCOUNTER — Ambulatory Visit (INDEPENDENT_AMBULATORY_CARE_PROVIDER_SITE_OTHER): Payer: Medicare HMO | Admitting: Podiatry

## 2021-02-18 DIAGNOSIS — M79675 Pain in left toe(s): Secondary | ICD-10-CM

## 2021-02-18 DIAGNOSIS — B351 Tinea unguium: Secondary | ICD-10-CM

## 2021-02-18 DIAGNOSIS — M79674 Pain in right toe(s): Secondary | ICD-10-CM

## 2021-02-18 DIAGNOSIS — E119 Type 2 diabetes mellitus without complications: Secondary | ICD-10-CM | POA: Diagnosis not present

## 2021-02-20 NOTE — Progress Notes (Signed)
Subjective: 81 y.o. returns the office today for painful, elongated, thickened toenails which he cannot trim himself. N denies any ulcerations or open lesions.  Denies any systemic complaints such as fevers, chills, nausea, vomiting.   PCP: [name], MD  Last A1c 7.6 on December 11, 2020  Objective: AAO 3, NAD DP/PT pulses palpable, CRT less than 3 seconds Nails hypertrophic, dystrophic, elongated, brittle, discolored 10. There is tenderness overlying the nails 1-5 bilaterally. There is no surrounding erythema or drainage along the nail sites. No open lesion identified bilaterally. No pain with calf compression, swelling, warmth, erythema.  Assessment: Patient presents with symptomatic onychomycosis; pre-ulcerative areas bilaterally  Plan: -Treatment options including alternatives, risks, complications were discussed -Nails sharply debrided 10 without complication/bleeding. -Recommend moisturizer daily and offloading. -Discussed daily foot inspection. -Follow-up as scheduled or sooner if any problems are to arise. In the meantime, encouraged to call the office with any questions, concerns, changes symptoms.  Ovid Curd, DPM

## 2021-04-16 ENCOUNTER — Emergency Department (HOSPITAL_COMMUNITY): Payer: Medicare HMO

## 2021-04-16 ENCOUNTER — Observation Stay (HOSPITAL_COMMUNITY)
Admission: EM | Admit: 2021-04-16 | Discharge: 2021-04-18 | Disposition: A | Discharge status: Home / Self Care | Payer: Medicare HMO | Attending: Internal Medicine | Admitting: Internal Medicine

## 2021-04-16 ENCOUNTER — Encounter (HOSPITAL_COMMUNITY): Payer: Self-pay

## 2021-04-16 DIAGNOSIS — Z720 Tobacco use: Secondary | ICD-10-CM | POA: Diagnosis present

## 2021-04-16 DIAGNOSIS — E119 Type 2 diabetes mellitus without complications: Secondary | ICD-10-CM

## 2021-04-16 DIAGNOSIS — I10 Essential (primary) hypertension: Secondary | ICD-10-CM | POA: Diagnosis present

## 2021-04-16 DIAGNOSIS — U071 COVID-19: Secondary | ICD-10-CM | POA: Diagnosis not present

## 2021-04-16 DIAGNOSIS — Z79899 Other long term (current) drug therapy: Secondary | ICD-10-CM | POA: Diagnosis not present

## 2021-04-16 DIAGNOSIS — Z7982 Long term (current) use of aspirin: Secondary | ICD-10-CM | POA: Insufficient documentation

## 2021-04-16 DIAGNOSIS — Z7984 Long term (current) use of oral hypoglycemic drugs: Secondary | ICD-10-CM | POA: Diagnosis not present

## 2021-04-16 DIAGNOSIS — H409 Unspecified glaucoma: Secondary | ICD-10-CM | POA: Diagnosis present

## 2021-04-16 DIAGNOSIS — F17210 Nicotine dependence, cigarettes, uncomplicated: Secondary | ICD-10-CM | POA: Insufficient documentation

## 2021-04-16 DIAGNOSIS — G4733 Obstructive sleep apnea (adult) (pediatric): Secondary | ICD-10-CM | POA: Diagnosis present

## 2021-04-16 DIAGNOSIS — E785 Hyperlipidemia, unspecified: Secondary | ICD-10-CM | POA: Diagnosis present

## 2021-04-16 DIAGNOSIS — J432 Centrilobular emphysema: Secondary | ICD-10-CM | POA: Diagnosis present

## 2021-04-16 DIAGNOSIS — I700 Atherosclerosis of aorta: Secondary | ICD-10-CM | POA: Diagnosis present

## 2021-04-16 LAB — CBC WITH DIFFERENTIAL/PLATELET
Abs Immature Granulocytes: 0.02 10*3/uL (ref 0.00–0.07)
Basophils Absolute: 0 10*3/uL (ref 0.0–0.1)
Basophils Relative: 0 %
Eosinophils Absolute: 0 10*3/uL (ref 0.0–0.5)
Eosinophils Relative: 0 %
HCT: 39.7 % (ref 39.0–52.0)
Hemoglobin: 12.8 g/dL — ABNORMAL LOW (ref 13.0–17.0)
Immature Granulocytes: 0 %
Lymphocytes Relative: 9 %
Lymphs Abs: 0.6 10*3/uL — ABNORMAL LOW (ref 0.7–4.0)
MCH: 26.4 pg (ref 26.0–34.0)
MCHC: 32.2 g/dL (ref 30.0–36.0)
MCV: 81.9 fL (ref 80.0–100.0)
Monocytes Absolute: 0.8 10*3/uL (ref 0.1–1.0)
Monocytes Relative: 13 %
Neutro Abs: 4.9 10*3/uL (ref 1.7–7.7)
Neutrophils Relative %: 78 %
Platelets: 179 10*3/uL (ref 150–400)
RBC: 4.85 MIL/uL (ref 4.22–5.81)
RDW: 15.3 % (ref 11.5–15.5)
WBC: 6.3 10*3/uL (ref 4.0–10.5)
nRBC: 0 % (ref 0.0–0.2)

## 2021-04-16 MED ORDER — ACETAMINOPHEN 500 MG PO TABS
1000.0000 mg | ORAL_TABLET | Freq: Once | ORAL | Status: AC
  Administered 2021-04-16: 1000 mg via ORAL
  Filled 2021-04-16: qty 2

## 2021-04-16 NOTE — ED Provider Notes (Signed)
?[facility]-EMERGENCY DEPT ?Provider Note ? ? ?CSN: [id number] ?Arrival date & time: 04/16/21  2251 ? ?  ? ?History ? ?No chief complaint on file. ? ? ?Matthew Hooper is a 81 y.o. male. ? ?Patient is a 81 year old male with past medical history of type 2 diabetes, hypertension, hyperlipidemia, obstructive sleep apnea.  Patient presenting today for evaluation of fever, cough, and home COVID test which was positive.  He denies to me he is having any chest pain, difficulty breathing.  He has felt warm but has not taken his temperature.  Temperature upon arrival was 101.3.  He denies nausea, vomiting, or diarrhea.  Patient has been vaccinated and has received boosters. ? ?The history is provided by the patient.  ? ?  ? ?Home Medications ?Prior to Admission medications   ?Medication Sig Start Date End Date Taking? Authorizing Provider  ?aspirin 81 MG EC tablet Take 1 tablet by mouth daily. 07/27/18   [provider]  ?D3-50 1.25 MG (50000 UT) capsule Take 50,000 Units by mouth as directed. 03/10/19   [provider]  ?dorzolamide (TRUSOPT) 2 % ophthalmic solution  10/02/18   [provider]  ?Lancets (ONETOUCH DELICA PLUS LANCET33G) MISC Apply 1 each topically daily. 10/08/19   [provider]  ?latanoprost (XALATAN) 0.005 % ophthalmic solution  09/23/19   [provider]  ?metFORMIN (GLUCOPHAGE) 1000 MG tablet Take 1,000 mg by mouth 2 (two) times daily with a meal.    [provider]  ?metFORMIN (GLUCOPHAGE) 1000 MG tablet Take 1 tablet by mouth 2 (two) times daily with a meal. 09/18/20   [provider]  ?pravastatin (PRAVACHOL) 80 MG tablet Take 80 mg by mouth daily.    [provider]  ?tamsulosin (FLOMAX) 0.4 MG CAPS capsule Take 0.4 mg by mouth daily after supper.  09/30/18   [provider]  ?valsartan (DIOVAN) 160 MG tablet Take 160 mg by mouth daily.  10/14/18   [provider]  ?   ? ?Allergies    ?Patient has no known  allergies.   ? ?Review of Systems   ?Review of Systems  ?All other systems reviewed and are negative. ? ?Physical Exam ?Updated Vital Signs ?BP (!) 159/79   Pulse 97   Temp (!) 101.3 ?F (38.5 ?C) (Oral)   Resp (!) 21   SpO2 93%  ?Physical Exam ?Vitals and nursing note reviewed.  ?Constitutional:   ?   General: He is not in acute distress. ?   Appearance: He is well-developed. He is not diaphoretic.  ?HENT:  ?   Head: Normocephalic and atraumatic.  ?Cardiovascular:  ?   Rate and Rhythm: Normal rate and regular rhythm.  ?   Heart sounds: No murmur heard. ?  No friction rub.  ?Pulmonary:  ?   Effort: Pulmonary effort is normal. No respiratory distress.  ?   Breath sounds: Normal breath sounds. No wheezing or rales.  ?Abdominal:  ?   General: Bowel sounds are normal. There is no distension.  ?   Palpations: Abdomen is soft.  ?   Tenderness: There is no abdominal tenderness.  ?Musculoskeletal:     ?   General: No swelling or tenderness. Normal range of motion.  ?   Cervical back: Normal range of motion and neck supple.  ?   Right lower leg: No edema.  ?   Left lower leg: No edema.  ?Skin: ?   General: Skin is warm and dry.  ?Neurological:  ?  Mental Status: He is alert and oriented to person, place, and time.  ?   Coordination: Coordination normal.  ? ? ?ED Results / Procedures / Treatments   ?Labs ?(all labs ordered are listed, but only abnormal results are displayed) ?Labs Reviewed  ?RESP PANEL BY RT-PCR (FLU A&B, COVID) [id number]  ?COMPREHENSIVE METABOLIC PANEL  ?CBC WITH DIFFERENTIAL/PLATELET  ? ? ?EKG ?None ? ?Radiology ?No results found. ? ?Procedures ?Procedures  ? ? ?Medications Ordered in ED ?Medications  ?acetaminophen (TYLENOL) tablet 1,000 mg (has no administration in time range)  ? ? ?ED Course/ Medical Decision Making/ A&P ? ?This patient presents to the ED for concern of shortness of breath and weakness with positive home COVID test, this involves an extensive number of treatment options, and is a  complaint that carries with it a high risk of complications and morbidity.  The differential diagnosis includes COVID-19, bacterial pneumonia ? ? ?Co morbidities that complicate the patient evaluation ? ?None ? ? ?Additional history obtained: ? ?Additional history obtained from wife at bedside ?No external records needed ? ? ?Lab Tests: ? ?I Ordered, and personally interpreted labs.  The pertinent results include: CBC, metabolic panel, both of which are unremarkable.  COVID test is positive ? ? ?Imaging Studies ordered: ? ?I ordered imaging studies including chest x-ray ?I independently visualized and interpreted imaging which showed no acute process ?I agree with the radiologist interpretation ? ? ?Cardiac Monitoring: ? ?The patient was maintained on a cardiac monitor.  I personally viewed and interpreted the cardiac monitored which showed an underlying rhythm of: Sinus rhythm ? ? ?Medicines ordered and prescription drug management: ? ?I ordered medication including Paxlovid for COVID-19 ?Reevaluation of the patient after these medicines showed that the patient unchanged ?I have reviewed the patients home medicines and have made adjustments as needed ? ? ?Test Considered: ? ?No other test considered ? ? ?Critical Interventions: ? ?None ? ? ?Consultations Obtained: ? ?I requested consultation with the hospitalist, Dr. Loney Loh,  and discussed lab and imaging findings as well as pertinent plan - they recommend: Admission ? ? ?Problem List / ED Course: ? ?Patient with a 24-hour history of body aches, severe weakness, and fever in the setting of positive home COVID test.  He presents today for weakness.  According to the wife, he had an episode this evening where he fell on the floor and could not stand and walk.  Family members had difficulty getting him from the floor.  He was brought here by EMS.  Patient arrives with stable vital signs and no hypoxia, but did have fever of 101.5. ?Positive COVID status confirmed by  nasal swab here in the ER.  Patient is not having any respiratory distress and oxygen saturations are adequate on room air, however patient is too weak to ambulate and I do not feel can safely return home.  I have discussed care with the hospitalist and patient will be given Paxlovid and admitted for observation. ? ? ? ?Social Determinants of Health: ? ?None ? ? ? ? ?Final Clinical Impression(s) / ED Diagnoses ?Final diagnoses:  ?None  ? ? ?Rx / DC Orders ?ED Discharge Orders   ? ? None  ? ?  ? ? ?  ?Geoffery Lyons, MD ?04/17/21 [phone] ? ?

## 2021-04-16 NOTE — ED Triage Notes (Signed)
Pt complains of a cough since yesterday and no appetite, pt took a COVID test at home and it was positive, pt has no further complaints ?EMS states a 101 fever ?

## 2021-04-17 ENCOUNTER — Other Ambulatory Visit: Payer: Self-pay

## 2021-04-17 ENCOUNTER — Encounter (HOSPITAL_COMMUNITY): Payer: Self-pay | Admitting: Internal Medicine

## 2021-04-17 DIAGNOSIS — H409 Unspecified glaucoma: Secondary | ICD-10-CM

## 2021-04-17 DIAGNOSIS — U071 COVID-19: Secondary | ICD-10-CM | POA: Diagnosis not present

## 2021-04-17 HISTORY — DX: Unspecified glaucoma: H40.9

## 2021-04-17 LAB — COMPREHENSIVE METABOLIC PANEL
ALT: 16 U/L (ref 0–44)
ALT: 17 U/L (ref 0–44)
AST: 20 U/L (ref 15–41)
AST: 21 U/L (ref 15–41)
Albumin: 3.6 g/dL (ref 3.5–5.0)
Albumin: 3.7 g/dL (ref 3.5–5.0)
Alkaline Phosphatase: 40 U/L (ref 38–126)
Alkaline Phosphatase: 41 U/L (ref 38–126)
Anion gap: 6 (ref 5–15)
Anion gap: 8 (ref 5–15)
BUN: 14 mg/dL (ref 8–23)
BUN: 14 mg/dL (ref 8–23)
CO2: 25 mmol/L (ref 22–32)
CO2: 26 mmol/L (ref 22–32)
Calcium: 8.3 mg/dL — ABNORMAL LOW (ref 8.9–10.3)
Calcium: 8.5 mg/dL — ABNORMAL LOW (ref 8.9–10.3)
Chloride: 102 mmol/L (ref 98–111)
Chloride: 103 mmol/L (ref 98–111)
Creatinine, Ser: 1.09 mg/dL (ref 0.61–1.24)
Creatinine, Ser: 1.31 mg/dL — ABNORMAL HIGH (ref 0.61–1.24)
GFR, Estimated: 55 mL/min — ABNORMAL LOW (ref >60)
GFR, Estimated: >60 mL/min (ref >60)
Glucose, Bld: 121 mg/dL — ABNORMAL HIGH (ref 70–99)
Glucose, Bld: 142 mg/dL — ABNORMAL HIGH (ref 70–99)
Potassium: 3.4 mmol/L — ABNORMAL LOW (ref 3.5–5.1)
Potassium: 3.8 mmol/L (ref 3.5–5.1)
Sodium: 134 mmol/L — ABNORMAL LOW (ref 135–145)
Sodium: 136 mmol/L (ref 135–145)
Total Bilirubin: 0.4 mg/dL (ref 0.3–1.2)
Total Bilirubin: 0.5 mg/dL (ref 0.3–1.2)
Total Protein: 7.1 g/dL (ref 6.5–8.1)
Total Protein: 7.2 g/dL (ref 6.5–8.1)

## 2021-04-17 LAB — CBC WITH DIFFERENTIAL/PLATELET
Abs Immature Granulocytes: 0.01 10*3/uL (ref 0.00–0.07)
Basophils Absolute: 0 10*3/uL (ref 0.0–0.1)
Basophils Relative: 0 %
Eosinophils Absolute: 0 10*3/uL (ref 0.0–0.5)
Eosinophils Relative: 0 %
HCT: 39.3 % (ref 39.0–52.0)
Hemoglobin: 13.1 g/dL (ref 13.0–17.0)
Immature Granulocytes: 0 %
Lymphocytes Relative: 19 %
Lymphs Abs: 0.9 10*3/uL (ref 0.7–4.0)
MCH: 27.2 pg (ref 26.0–34.0)
MCHC: 33.3 g/dL (ref 30.0–36.0)
MCV: 81.5 fL (ref 80.0–100.0)
Monocytes Absolute: 0.8 10*3/uL (ref 0.1–1.0)
Monocytes Relative: 15 %
Neutro Abs: 3.2 10*3/uL (ref 1.7–7.7)
Neutrophils Relative %: 66 %
Platelets: 167 10*3/uL (ref 150–400)
RBC: 4.82 MIL/uL (ref 4.22–5.81)
RDW: 15.4 % (ref 11.5–15.5)
WBC: 4.9 10*3/uL (ref 4.0–10.5)
nRBC: 0 % (ref 0.0–0.2)

## 2021-04-17 LAB — RESP PANEL BY RT-PCR (FLU A&B, COVID) ARPGX2
Influenza A by PCR: NEGATIVE
Influenza B by PCR: NEGATIVE
SARS Coronavirus 2 by RT PCR: POSITIVE — AB

## 2021-04-17 LAB — PHOSPHORUS: Phosphorus: 3.6 mg/dL (ref 2.5–4.6)

## 2021-04-17 LAB — PROCALCITONIN: Procalcitonin: <0.1 ng/mL

## 2021-04-17 LAB — HEMOGLOBIN A1C
Hgb A1c MFr Bld: 6.6 % — ABNORMAL HIGH (ref 4.8–5.6)
Mean Plasma Glucose: 142.72 mg/dL

## 2021-04-17 LAB — FERRITIN: Ferritin: 64 ng/mL (ref 24–336)

## 2021-04-17 LAB — GLUCOSE, CAPILLARY
Glucose-Capillary: 95 mg/dL (ref 70–99)
Glucose-Capillary: 91 mg/dL (ref 70–99)

## 2021-04-17 LAB — C-REACTIVE PROTEIN: CRP: 3.3 mg/dL — ABNORMAL HIGH (ref <1.0)

## 2021-04-17 LAB — CBG MONITORING, ED
Glucose-Capillary: 123 mg/dL — ABNORMAL HIGH (ref 70–99)
Glucose-Capillary: 117 mg/dL — ABNORMAL HIGH (ref 70–99)

## 2021-04-17 LAB — D-DIMER, QUANTITATIVE: D-Dimer, Quant: 0.85 ug/mL-FEU — ABNORMAL HIGH (ref 0.00–0.50)

## 2021-04-17 LAB — TROPONIN I (HIGH SENSITIVITY): Troponin I (High Sensitivity): 7 ng/L (ref <18)

## 2021-04-17 LAB — MAGNESIUM: Magnesium: 1.9 mg/dL (ref 1.7–2.4)

## 2021-04-17 MED ORDER — ONDANSETRON HCL 4 MG PO TABS: 4.0000 mg | ORAL_TABLET | Freq: Four times a day (QID) | ORAL | Status: DC | PRN

## 2021-04-17 MED ORDER — ASPIRIN EC 81 MG PO TBEC
81.0000 mg | DELAYED_RELEASE_TABLET | Freq: Every day | ORAL | Status: DC
  Administered 2021-04-17 – 2021-04-18 (×2): 81 mg via ORAL
  Filled 2021-04-17 (×2): qty 1

## 2021-04-17 MED ORDER — ASCORBIC ACID 500 MG PO TABS
500.0000 mg | ORAL_TABLET | Freq: Every day | ORAL | Status: DC
  Administered 2021-04-17 – 2021-04-18 (×2): 500 mg via ORAL
  Filled 2021-04-17 (×2): qty 1

## 2021-04-17 MED ORDER — ALBUTEROL SULFATE HFA 108 (90 BASE) MCG/ACT IN AERS: 2.0000 | INHALATION_SPRAY | RESPIRATORY_TRACT | Status: DC | PRN

## 2021-04-17 MED ORDER — ACETAMINOPHEN 325 MG PO TABS: 650.0000 mg | ORAL_TABLET | Freq: Four times a day (QID) | ORAL | Status: DC | PRN

## 2021-04-17 MED ORDER — PRAVASTATIN SODIUM 40 MG PO TABS
80.0000 mg | ORAL_TABLET | Freq: Every day | ORAL | Status: DC
  Administered 2021-04-17: 80 mg via ORAL
  Filled 2021-04-17: qty 2

## 2021-04-17 MED ORDER — ZINC SULFATE 220 (50 ZN) MG PO CAPS
220.0000 mg | ORAL_CAPSULE | Freq: Every day | ORAL | Status: DC
  Administered 2021-04-17 – 2021-04-18 (×2): 220 mg via ORAL
  Filled 2021-04-17 (×2): qty 1

## 2021-04-17 MED ORDER — NIRMATRELVIR/RITONAVIR (PAXLOVID) TABLET (RENAL DOSING)
2.0000 | ORAL_TABLET | Freq: Two times a day (BID) | ORAL | Status: DC
  Administered 2021-04-17 – 2021-04-18 (×3): 2 via ORAL
  Filled 2021-04-17: qty 20

## 2021-04-17 MED ORDER — ENOXAPARIN SODIUM 40 MG/0.4ML IJ SOSY
40.0000 mg | PREFILLED_SYRINGE | INTRAMUSCULAR | Status: DC
  Administered 2021-04-17 – 2021-04-18 (×2): 40 mg via SUBCUTANEOUS
  Filled 2021-04-17 (×2): qty 0.4

## 2021-04-17 MED ORDER — LATANOPROST 0.005 % OP SOLN
1.0000 [drp] | Freq: Every day | OPHTHALMIC | Status: DC
  Administered 2021-04-17: 1 [drp] via OPHTHALMIC
  Filled 2021-04-17: qty 2.5

## 2021-04-17 MED ORDER — ONDANSETRON HCL 4 MG/2ML IJ SOLN: 4.0000 mg | Freq: Four times a day (QID) | INTRAMUSCULAR | Status: DC | PRN

## 2021-04-17 MED ORDER — DORZOLAMIDE HCL 2 % OP SOLN
1.0000 [drp] | Freq: Every evening | OPHTHALMIC | Status: DC
  Administered 2021-04-17: 1 [drp] via OPHTHALMIC
  Filled 2021-04-17: qty 10

## 2021-04-17 MED ORDER — INSULIN ASPART 100 UNIT/ML IJ SOLN
0.0000 [IU] | Freq: Three times a day (TID) | INTRAMUSCULAR | Status: DC
  Administered 2021-04-17: 2 [IU] via SUBCUTANEOUS
  Filled 2021-04-17: qty 0.15

## 2021-04-17 MED ORDER — METFORMIN HCL 500 MG PO TABS
1000.0000 mg | ORAL_TABLET | Freq: Two times a day (BID) | ORAL | Status: DC
  Administered 2021-04-17 – 2021-04-18 (×3): 1000 mg via ORAL
  Filled 2021-04-17 (×3): qty 2

## 2021-04-17 MED ORDER — NICOTINE 14 MG/24HR TD PT24
14.0000 mg | MEDICATED_PATCH | Freq: Every day | TRANSDERMAL | Status: DC
  Administered 2021-04-17 – 2021-04-18 (×2): 14 mg via TRANSDERMAL
  Filled 2021-04-17 (×2): qty 1

## 2021-04-17 MED ORDER — ACETAMINOPHEN 650 MG RE SUPP: 650.0000 mg | Freq: Four times a day (QID) | RECTAL | Status: DC | PRN

## 2021-04-17 MED ORDER — IRBESARTAN 150 MG PO TABS
150.0000 mg | ORAL_TABLET | Freq: Every day | ORAL | Status: DC
Start: 2021-04-17 — End: 2021-04-18
  Administered 2021-04-17 – 2021-04-18 (×2): 150 mg via ORAL
  Filled 2021-04-17 (×2): qty 1

## 2021-04-17 MED ORDER — GUAIFENESIN-DM 100-10 MG/5ML PO SYRP: 10.0000 mL | ORAL_SOLUTION | ORAL | Status: DC | PRN

## 2021-04-17 NOTE — H&P (Signed)
?History and Physical  ? ? ?Patient: Matthew Hooper QQI:[id number] DOB: 01-26-1941 ?DOA: 04/16/2021 ?DOS: the patient was seen and examined on 04/17/2021 ?PCP: [name], MD  ?Patient coming from: Home ? ?Chief Complaint: Cough, weakness and decreased appetite. ? ?HPI: Matthew Hooper is a 81 y.o. male with medical history significant of glaucoma, type 2 diabetes, hyperlipidemia, hypertension, BPH, aortic atherosclerosis, emphysema who is coming to the emergency department with complaints of 2-3 days of progressively worse weakness, fatigue, decreased appetite, frontal headache, nonproductive cough, sore throat, malaise, arthralgias, myalgias.  His COVID-19 PCR was positive at home and the ED.  He has already received his 2 vaccines in 2 boosters.  He denied wheezing or hemoptysis.  No chest pain, palpitations, diaphoresis, PND, orthopnea or pitting edema of the lower extremities.  Denied abdominal pain, nausea, emesis, diarrhea, constipation, melena or hematochezia.  No flank pain, dysuria, frequency or hematuria.  No polyuria, polydipsia, polyphagia or blurred vision. ? ?ED course: Initial vital signs were temperature 101.3 ?F, pulse 114, respiration 21, BP 159/79 mmHg O2 sat 93% on room air.  The patient was started on Paxlovid twice daily. ? ?Lab work: CBC is her white count 6.3, hemoglobin 12.8 g/dL platelets 941.  D-dimer was 0.85.  Coronavirus PCR was positive and influenza A/B was negative.  Troponin was normal.  BMP showed a sodium 134 mmol/L, glucose of 142, creatinine 1.31 and calcium 8.3 mg/dL. ? ?Imaging: One-view portable chest radiograph shows COPD without acute abnormality. ?  ?Review of Systems: As mentioned in the history of present illness. All other systems reviewed and are negative. ?Past Medical History:  ?Diagnosis Date  ? Diabetes mellitus without complication (HCC)   ? Glaucoma 04/17/2021  ? Hyperlipidemia   ? Hypertension   ? ?Past Surgical History:  ?Procedure Laterality Date  ? APPENDECTOMY     ? ?Social History:  reports that he has been smoking cigarettes. He has a 46.50 pack-year smoking history. He has never used smokeless tobacco. No history on file for alcohol use and drug use. ? ?No Known Allergies ? ?Family History  ?Problem Relation Age of Onset  ? Diabetes Mellitus II Mother   ? Hypertension Mother   ? Pancreatic cancer Mother   ? Colon cancer Father   ? Diabetes Mellitus II Sister   ? Diabetes Mellitus II Brother   ? ? ?Prior to Admission medications   ?Medication Sig Start Date End Date Taking? Authorizing Provider  ?aspirin 81 MG EC tablet Take 1 tablet by mouth daily. 07/27/18  Yes [provider]  ?D3-50 1.25 MG (50000 UT) capsule Take 50,000 Units by mouth daily. 03/10/19  Yes [provider]  ?dorzolamide (TRUSOPT) 2 % ophthalmic solution Place 1 drop into both eyes every evening. 10/02/18  Yes [provider]  ?latanoprost (XALATAN) 0.005 % ophthalmic solution Place 1 drop into both eyes at bedtime. 09/23/19  Yes [provider]  ?metFORMIN (GLUCOPHAGE) 1000 MG tablet Take 1,000 mg by mouth 2 (two) times daily with a meal.   Yes [provider]  ?pravastatin (PRAVACHOL) 80 MG tablet Take 80 mg by mouth at bedtime.   Yes [provider]  ?valsartan (DIOVAN) 160 MG tablet Take 160 mg by mouth daily.  10/14/18  Yes [provider]  ?Lancets (ONETOUCH DELICA PLUS LANCET33G) MISC Apply 1 each topically daily. 10/08/19   [provider]  ? ? ?Physical Exam: ?Vitals:  ? 04/17/21 0849 04/17/21 0930 04/17/21 1000 04/17/21 1100  ?BP: (!) 146/72 135/70 139/80  137/74  ?Pulse: 83 91 86 90  ?Resp: (!) 23 15 20  (!) 25  ?Temp:      ?TempSrc:      ?SpO2: 97% 98% 97% 95%  ?Weight:      ?Height:      ? ?Physical Exam ?Constitutional:   ?   Appearance: He is obese.  ?HENT:  ?   Head: Normocephalic.  ?   Mouth/Throat:  ?   Mouth: Mucous membranes are dry.  ?Eyes:  ?   Pupils: Pupils are equal, round, and reactive to light.  ?Neck:  ?    Vascular: No JVD.  ?Cardiovascular:  ?   Rate and Rhythm: Normal rate and regular rhythm.  ?Pulmonary:  ?   Effort: Pulmonary effort is normal.  ?   Breath sounds: Normal breath sounds.  ?Abdominal:  ?   General: There is no distension.  ?   Palpations: Abdomen is soft.  ?   Tenderness: There is no abdominal tenderness.  ?Musculoskeletal:  ?   Cervical back: Neck supple.  ?   Right lower leg: No edema.  ?   Left lower leg: No edema.  ?Skin: ?   General: Skin is warm and dry.  ?   Coloration: Skin is not jaundiced.  ?Neurological:  ?   General: No focal deficit present.  ?   Mental Status: He is alert and oriented to person, place, and time.  ?Psychiatric:     ?   Mood and Affect: Mood normal.     ?   Behavior: Behavior normal.  ? ? ?Data Reviewed: ? ?There are no new results to review at this time. ? ?Assessment and Plan: ?Principal Problem: ?  COVID-19 virus infection ?Observation/telemetry. ?No oxygen requirement at this time. ?Continue antitussive as needed. ?Continue albuterol MDI as needed. ?Continue Paxlovid twice daily. ?Repeat CBC and CMP. ?Check inflammatory markers. ?Follow-up CBC, CMP and inflammatory markers in the morning. ? ?Active Problems: ?  OSA (obstructive sleep apnea) ?CPAP at bedtime. ? ?  Tobacco abuse ?Nicotine replacement therapy ordered. ?Tobacco cessation advised. ? ?  Aortic atherosclerosis (HCC) ?  Hyperlipidemia ?Smoking cessation ?Continue pravastatin 80 mg p.o. daily. ?Continue aspirin 81 mg p.o. daily. ? ?  Centrilobular emphysema (HCC) ?Tobacco cessation. ?Supplemental oxygen bronchodilators as needed. ? ?  Type 2 diabetes mellitus (HCC) ?Carbohydrate modified diet. ?Continue metformin 1000 mg p.o. twice daily. ?CBG monitoring with RI SS. ? ?  Hypertension ?Continue valsartan 160 mg p.o. daily. ? ?  Glaucoma ?Continue Trusopt and Xalatan drops. ? ? ? ? ? ? Advance Care Planning:   Code Status: Full Code  ? ?Consults:  ? ?Family Communication:  ? ?Severity of Illness: ?The appropriate  patient status for this patient is OBSERVATION. Observation status is judged to be reasonable and necessary in order to provide the required intensity of service to ensure the patient's safety. The patient's presenting symptoms, physical exam findings, and initial radiographic and laboratory data in the context of their medical condition is felt to place them at decreased risk for further clinical deterioration. Furthermore, it is anticipated that the patient will be medically stable for discharge from the hospital within 2 midnights of admission.  ? ?Author: ? , MD ?04/17/2021 11:34 AM ? ?For on call review [email or web address].  ? ?This document was prepared using Dragon voice recognition software and may contain some unintended transcription errors. ?

## 2021-04-17 NOTE — Plan of Care (Signed)
  Problem: Coping: Goal: Level of anxiety will decrease Outcome: Progressing   Problem: Elimination: Goal: Will not experience complications related to bowel motility Outcome: Progressing   Problem: Safety: Goal: Ability to remain free from injury will improve Outcome: Progressing   

## 2021-04-18 DIAGNOSIS — J432 Centrilobular emphysema: Secondary | ICD-10-CM | POA: Diagnosis not present

## 2021-04-18 DIAGNOSIS — U071 COVID-19: Secondary | ICD-10-CM | POA: Diagnosis not present

## 2021-04-18 LAB — GLUCOSE, CAPILLARY
Glucose-Capillary: 115 mg/dL — ABNORMAL HIGH (ref 70–99)
Glucose-Capillary: 93 mg/dL (ref 70–99)

## 2021-04-18 MED ORDER — NICOTINE 14 MG/24HR TD PT24: 14.0000 mg | MEDICATED_PATCH | Freq: Every day | TRANSDERMAL | 0 refills | Status: AC

## 2021-04-18 MED ORDER — NIRMATRELVIR/RITONAVIR (PAXLOVID) TABLET (RENAL DOSING): 2.0000 | ORAL_TABLET | Freq: Two times a day (BID) | ORAL | 0 refills | Status: AC

## 2021-04-18 NOTE — Discharge Summary (Signed)
Physician Discharge Summary   Patient: Matthew Hooper MRN: [id number] DOB: Apr 29, 1940  Admit date:     04/16/2021  Discharge date: 04/18/21  Discharge Physician: Rickey Barbara   PCP: [name], MD   Recommendations at discharge:    Follow up with PCP in 1-2 weeks  Discharge Diagnoses: Principal Problem:   COVID-19 virus infection Active Problems:   OSA (obstructive sleep apnea)   Tobacco abuse   Aortic atherosclerosis (HCC)   Centrilobular emphysema (HCC)   Type 2 diabetes mellitus (HCC)   Hyperlipidemia   Hypertension   Glaucoma  Resolved Problems:   * No resolved hospital problems. *  Hospital Course: 81 y.o. male with medical history significant of glaucoma, type 2 diabetes, hyperlipidemia, hypertension, BPH, aortic atherosclerosis, emphysema who is coming to the emergency department with complaints of 2-3 days of progressively worse weakness, fatigue, decreased appetite, frontal headache, nonproductive cough, sore throat, malaise, arthralgias, myalgias.  His COVID-19 PCR was positive at home and the ED.  He has already received his 2 vaccines in 2 boosters.  He denied wheezing or hemoptysis.  No chest pain, palpitations, diaphoresis, PND, orthopnea or pitting edema of the lower extremities.  Denied abdominal pain, nausea, emesis, diarrhea, constipation, melena or hematochezia.  No flank pain, dysuria, frequency or hematuria.  No polyuria, polydipsia, polyphagia or blurred vision.  Assessment and Plan: No notes have been filed under this hospital service. Service: Hospitalist  Principal Problem:   COVID-19 virus infection No oxygen requirement this visit Continue antitussive as needed. Contiued albuterol MDI as needed. Continue Paxlovid twice daily to complete 5 days Remained medically stable and eager to discharge home today Did well with PT   Active Problems:   OSA (obstructive sleep apnea) CPAP at bedtime.     Tobacco abuse Nicotine replacement therapy  ordered. Tobacco cessation advised. Provided rx for nicotine patch as requested     Aortic atherosclerosis (HCC)   Hyperlipidemia Smoking cessation Continue pravastatin 80 mg p.o. daily. Continue aspirin 81 mg p.o. daily.     Centrilobular emphysema (HCC) Tobacco cessation. Supplemental oxygen bronchodilators as needed.     Type 2 diabetes mellitus (HCC) Carbohydrate modified diet. Continue metformin 1000 mg p.o. twice daily. Continued SSI while in hospital     Hypertension Continue valsartan 160 mg p.o. daily.     Glaucoma Continue Trusopt and Xalatan drops.        Consultants:  Procedures performed:   Disposition: Home Diet recommendation:  Regular diet DISCHARGE MEDICATION: Allergies as of 04/18/2021   No Known Allergies      Medication List     TAKE these medications    aspirin 81 MG EC tablet Take 1 tablet by mouth daily.   D3-50 1.25 MG (50000 UT) capsule Generic drug: Cholecalciferol Take 50,000 Units by mouth daily.   dorzolamide 2 % ophthalmic solution Commonly known as: TRUSOPT Place 1 drop into both eyes every evening.   latanoprost 0.005 % ophthalmic solution Commonly known as: XALATAN Place 1 drop into both eyes at bedtime.   metFORMIN 1000 MG tablet Commonly known as: GLUCOPHAGE Take 1,000 mg by mouth 2 (two) times daily with a meal.   nicotine 14 mg/24hr patch Commonly known as: NICODERM CQ - dosed in mg/24 hours Place 1 patch (14 mg total) onto the skin daily for 28 days. Start taking on: April 19, 2021   nirmatrelvir/ritonavir EUA (renal dosing) 10 x 150 MG & 10 x 100MG  Tabs Commonly known as: PAXLOVID Take 2 tablets by mouth 2 (  two) times daily for 5 days. Patient GFR is >60. Take nirmatrelvir (150 mg) one tablet twice daily for 5 days and ritonavir (100 mg) one tablet twice daily for 5 days.   OneTouch Delica Plus Lancet33G Misc Apply 1 each topically daily.   pravastatin 80 MG tablet Commonly known as: PRAVACHOL Take 80  mg by mouth at bedtime.   valsartan 160 MG tablet Commonly known as: DIOVAN Take 160 mg by mouth daily.        Follow-up Information     [name], MD Follow up in 2 week(s).   Specialty: Family Medicine Why: Hospital follow up Contact information: [address] [place] [address] [phone]                Discharge Exam: Ceasar Mons Weights   04/17/21 0758  Weight: 96.6 kg   General exam: Awake, laying in bed, in nad Respiratory system: Normal respiratory effort, no wheezing Cardiovascular system: regular rate, s1, s2 Gastrointestinal system: Soft, nondistended, positive BS Central nervous system: CN2-12 grossly intact, strength intact Extremities: Perfused, no clubbing Skin: Normal skin turgor, no notable skin lesions seen Psychiatry: Mood normal // no visual hallucinations    Condition at discharge: good  The results of significant diagnostics from this hospitalization (including imaging, microbiology, ancillary and laboratory) are listed below for reference.   Imaging Studies: DG Chest Port 1 View  Result Date: 04/16/2021 CLINICAL DATA:  Cough and fever, COVID-19 positivity, initial encounter EXAM: PORTABLE CHEST 1 VIEW COMPARISON:  08/07/2017 FINDINGS: Cardiac shadow is within normal limits. The lungs are hyperinflated but clear. No bony abnormality is noted. IMPRESSION: COPD without acute abnormality. Electronically Signed   By: Alcide Clever M.D.   On: 04/16/2021 23:28    Microbiology: Results for orders placed or performed during the hospital encounter of 04/16/21  Resp Panel by RT-PCR (Flu A&B, Covid) Nasopharyngeal Swab     Status: Abnormal   Collection Time: 04/16/21 11:44 PM   Specimen: Nasopharyngeal Swab; Nasopharyngeal(NP) swabs in vial transport medium  Result Value Ref Range Status   SARS Coronavirus 2 by RT PCR POSITIVE (A) NEGATIVE Final    Comment: (NOTE) SARS-CoV-2 target nucleic acids are DETECTED.  The SARS-CoV-2 RNA is  generally detectable in upper respiratory specimens during the acute phase of infection. Positive results are indicative of the presence of the identified virus, but do not rule out bacterial infection or co-infection with other pathogens not detected by the test. Clinical correlation with patient history and other diagnostic information is necessary to determine patient infection status. The expected result is Negative.  Fact Sheet for Patients: BloggerCourse.com  Fact Sheet for Healthcare Providers: SeriousBroker.it  This test is not yet approved or cleared by the [place] FDA and  has been authorized for detection and/or diagnosis of SARS-CoV-2 by FDA under an Emergency Use Authorization (EUA).  This EUA will remain in effect (meaning this test can be used) for the duration of  the COVID-19 declaration under Section 564(b)(1) of the A ct, 21 U.S.C. section 360bbb-3(b)(1), unless the authorization is terminated or revoked sooner.     Influenza A by PCR NEGATIVE NEGATIVE Final   Influenza B by PCR NEGATIVE NEGATIVE Final    Comment: (NOTE) The Xpert Xpress SARS-CoV-2/FLU/RSV plus assay is intended as an aid in the diagnosis of influenza from Nasopharyngeal swab specimens and should not be used as a sole basis for treatment. Nasal washings and aspirates are unacceptable for Xpert Xpress SARS-CoV-2/FLU/RSV testing.  Fact Sheet  for Patients: BloggerCourse.comhttps://www.fda.gov/media/152166/download  Fact Sheet for Healthcare Providers: SeriousBroker.ithttps://www.fda.gov/media/152162/download  This test is not yet approved or cleared by the [place] FDA and has been authorized for detection and/or diagnosis of SARS-CoV-2 by FDA under an Emergency Use Authorization (EUA). This EUA will remain in effect (meaning this test can be used) for the duration of the COVID-19 declaration under Section 564(b)(1) of the Act, 21 U.S.C. section 360bbb-3(b)(1),  unless the authorization is terminated or revoked.  Performed at [facility], [address] [place], [place] [id number]     Labs: CBC: Recent Labs  Lab 04/16/21 2344 04/17/21 0800  WBC 6.3 4.9  NEUTROABS 4.9 3.2  HGB 12.8* 13.1  HCT 39.7 39.3  MCV 81.9 81.5  PLT 179 167   Basic Metabolic Panel: Recent Labs  Lab 04/16/21 2344 04/17/21 0800  NA 134* 136  K 3.8 3.4*  CL 102 103  CO2 26 25  GLUCOSE 142* 121*  BUN 14 14  CREATININE 1.31* 1.09  CALCIUM 8.3* 8.5*  MG  --  1.9  PHOS  --  3.6   Liver Function Tests: Recent Labs  Lab 04/16/21 2344 04/17/21 0800  AST 20 21  ALT 16 17  ALKPHOS 41 40  BILITOT 0.4 0.5  PROT 7.1 7.2  ALBUMIN 3.7 3.6   CBG: Recent Labs  Lab 04/17/21 1219 04/17/21 1730 04/17/21 2202 04/18/21 0725 04/18/21 1109  GLUCAP 117* 95 91 115* 93    Discharge time spent: less than 30 minutes.  Signed: Rickey BarbaraStephen Kahlan Engebretson, MD [facility] 04/18/2021

## 2021-04-18 NOTE — Evaluation (Signed)
Physical Therapy Evaluation ?Patient Details ?Name: Matthew Hooper ?MRN: [id number] ?DOB: 04-28-1940 ?Today's Date: 04/18/2021 ? ?History of Present Illness ? Matthew Hooper is a 81 y.o. male with medical history significant of glaucoma, type 2 diabetes, hyperlipidemia, hypertension, BPH, aortic atherosclerosis, emphysema who is coming to the emergency department with complaints of 2-3 days of progressively worse weakness, fatigue, decreased appetite, frontal headache, nonproductive cough, sore throat, malaise, arthralgias, myalgias.  His COVID-19 PCR was positive and had a fall at home  ?Clinical Impression ? The patient is eager to Dc . Patient is ambulatory in room with no noted balance deficits. No further PT needs. Patient lives in level home and does not require DME.   MD notified of above.  ?   ? ?Recommendations for follow up therapy are one component of a multi-disciplinary discharge planning process, led by the attending physician.  Recommendations may be updated based on patient status, additional functional criteria and insurance authorization. ? ?Follow Up Recommendations No PT follow up ? ?  ?Assistance Recommended at Discharge None  ?Patient can return home with the following ? Assist for transportation ? ?  ?Equipment Recommendations None recommended by PT  ?Recommendations for Other Services ?    ?  ?Functional Status Assessment Patient has not had a recent decline in their functional status  ? ?  ?Precautions / Restrictions Precautions ?Precautions: None  ? ?  ? ?Mobility ? Bed Mobility ?Overal bed mobility: Independent ?  ?  ?  ?  ?  ?  ?  ?  ? ?Transfers ?Overall transfer level: Independent ?  ?  ?  ?  ?  ?  ?  ?  ?  ?  ? ?Ambulation/Gait ?Ambulation/Gait assistance: Independent ?  ?Assistive device: None ?Gait Pattern/deviations: WFL(Within Functional Limits) ?  ?Gait velocity interpretation: >2.62 ft/sec, indicative of community ambulatory ?  ?  ? ?Stairs ?  ?  ?  ?  ?  ? ?Wheelchair Mobility ?  ? ?Modified  Rankin (Stroke Patients Only) ?  ? ?  ? ?Balance Overall balance assessment: No apparent balance deficits (not formally assessed) ?  ?  ?  ?  ?  ?  ?  ?  ?  ?  ?  ?  ?  ?  ?  ?  ?  ?  ?   ? ? ? ?Pertinent Vitals/Pain Pain Assessment ?Pain Assessment: No/denies pain  ? ? ?Home Living Family/patient expects to be discharged to:: Private residence ?Living Arrangements: Spouse/significant other ?Available Help at Discharge: Family ?Type of Home: House ?Home Access: Level entry ?  ?  ?  ?Home Layout: One level ?Home Equipment: None ?   ?  ?Prior Function Prior Level of Function : Independent/Modified Independent ?  ?  ?  ?  ?  ?  ?  ?  ?  ? ? ?Hand Dominance  ?   ? ?  ?Extremity/Trunk Assessment  ? Upper Extremity Assessment ?Upper Extremity Assessment: Overall WFL for tasks assessed ?  ? ?Lower Extremity Assessment ?Lower Extremity Assessment: Overall WFL for tasks assessed ?  ? ?Cervical / Trunk Assessment ?Cervical / Trunk Assessment: Normal  ?Communication  ? Communication: No difficulties  ?Cognition Arousal/Alertness: Awake/alert ?Behavior During Therapy: [facility] for tasks assessed/performed ?Overall Cognitive Status: Within Functional Limits for tasks assessed ?  ?  ?  ?  ?  ?  ?  ?  ?  ?  ?  ?  ?  ?  ?  ?  ?  ?  ?  ? ?  ?  General Comments   ? ?  ?Exercises    ? ?Assessment/Plan  ?  ?PT Assessment Patient does not need any further PT services  ?PT Problem List   ? ?   ?  ?PT Treatment Interventions     ? ?PT Goals (Current goals can be found in the Care Plan section)  ?Acute Rehab PT Goals ?Patient Stated Goal: go home ?PT Goal Formulation: All assessment and education complete, DC therapy ? ?  ?Frequency   ?  ? ? ?Co-evaluation   ?  ?  ?  ?  ? ? ?  ?AM-PAC PT "6 Clicks" Mobility  ?Outcome Measure Help needed turning from your back to your side while in a flat bed without using bedrails?: None ?Help needed moving from lying on your back to sitting on the side of a flat bed without using bedrails?: None ?Help needed  moving to and from a bed to a chair (including a wheelchair)?: None ?Help needed standing up from a chair using your arms (e.g., wheelchair or bedside chair)?: None ?Help needed to walk in hospital room?: None ?Help needed climbing 3-5 steps with a railing? : None ?6 Click Score: 24 ? ?  ?End of Session   ?Activity Tolerance: Patient tolerated treatment well ?Patient left: in bed;with family/visitor present ?Nurse Communication: Mobility status ?PT Visit Diagnosis: History of falling (Z91.81) ?  ? ?Time: [id number] ?PT Time Calculation (min) (ACUTE ONLY): 12 min ? ? ?Charges:   PT Evaluation ?$PT Eval Low Complexity: 1 Low ?  ?  ?   ? ? ?Tresa Endo PT ?Acute Rehabilitation Services ?Pager [phone] ?Office [phone] ? ? ?Claretha Cooper ?04/18/2021, 2:20 PM ? ?

## 2021-05-21 ENCOUNTER — Ambulatory Visit (INDEPENDENT_AMBULATORY_CARE_PROVIDER_SITE_OTHER): Payer: Medicare HMO | Admitting: Podiatry

## 2021-05-21 DIAGNOSIS — E119 Type 2 diabetes mellitus without complications: Secondary | ICD-10-CM | POA: Diagnosis not present

## 2021-05-21 DIAGNOSIS — M79675 Pain in left toe(s): Secondary | ICD-10-CM

## 2021-05-21 DIAGNOSIS — B351 Tinea unguium: Secondary | ICD-10-CM

## 2021-05-21 DIAGNOSIS — M79674 Pain in right toe(s): Secondary | ICD-10-CM

## 2021-05-22 NOTE — Progress Notes (Signed)
Subjective: ?81 y.o. returns the office today for painful, elongated, thickened toenails which he cannot trim himself. Denies any ulcerations or open lesions.  Denies any systemic complaints such as fevers, chills, nausea, vomiting.  ? ?PCP: Matthew Inch, MD ? ?Last A1c 6.6 on 04/17/2021 ? ?Objective: ?AAO ?3, NAD ?DP/PT pulses palpable, CRT less than 3 seconds ?Nails hypertrophic, dystrophic, elongated, brittle, discolored ?10. There is tenderness overlying the nails 1-5 bilaterally. There is no surrounding erythema or drainage along the nail sites. ?No open lesion identified bilaterally. ?No pain with calf compression, swelling, warmth, erythema. ? ?Assessment: ?Patient presents with symptomatic onychomycosis; pre-ulcerative areas bilaterally ? ?Plan: ?-Treatment options including alternatives, risks, complications were discussed ?-Nails sharply debrided ?10 without complication/bleeding. ?-Recommend moisturizer daily and offloading. ?-Discussed daily foot inspection. ?-Follow-up as scheduled or sooner if any problems are to arise. In the meantime, encouraged to call the office with any questions, concerns, changes symptoms. ? ?Matthew Hooper, DPM ? ?

## 2021-08-27 ENCOUNTER — Ambulatory Visit (INDEPENDENT_AMBULATORY_CARE_PROVIDER_SITE_OTHER): Payer: Medicare HMO | Admitting: Podiatry

## 2021-08-27 DIAGNOSIS — E119 Type 2 diabetes mellitus without complications: Secondary | ICD-10-CM

## 2021-08-27 DIAGNOSIS — M79675 Pain in left toe(s): Secondary | ICD-10-CM | POA: Diagnosis not present

## 2021-08-27 DIAGNOSIS — B351 Tinea unguium: Secondary | ICD-10-CM | POA: Diagnosis not present

## 2021-08-27 DIAGNOSIS — M79674 Pain in right toe(s): Secondary | ICD-10-CM | POA: Diagnosis not present

## 2021-08-27 NOTE — Progress Notes (Signed)
Subjective: 81 y.o. returns the office today for painful, elongated, thickened toenails which he cannot trim himself. Denies any ulcerations or open lesions.  Denies any systemic complaints such as fevers, chills, nausea, vomiting.   PCP: [name], MD  Last A1c 6.6 on 04/17/2021  Objective: AAO 3, NAD DP/PT pulses palpable, CRT less than 3 seconds Nails hypertrophic, dystrophic, elongated, brittle, discolored 10. There is tenderness overlying the nails 1-5 bilaterally. There is no surrounding erythema or drainage along the nail sites. No open lesion identified bilaterally. Overall unchanged.  No pain with calf compression, swelling, warmth, erythema.  Assessment: Patient presents with symptomatic onychomycosis; pre-ulcerative areas bilaterally  Plan: -Treatment options including alternatives, risks, complications were discussed -Nails sharply debrided 10 without complication/bleeding. -Recommend moisturizer daily and offloading. -Discussed daily foot inspection. -Follow-up as scheduled or sooner if any problems are to arise. In the meantime, encouraged to call the office with any questions, concerns, changes symptoms.  Ovid Curd, DPMtrim

## 2021-11-26 ENCOUNTER — Ambulatory Visit (INDEPENDENT_AMBULATORY_CARE_PROVIDER_SITE_OTHER): Payer: Medicare HMO | Admitting: Podiatry

## 2021-11-26 DIAGNOSIS — M79675 Pain in left toe(s): Secondary | ICD-10-CM | POA: Diagnosis not present

## 2021-11-26 DIAGNOSIS — E119 Type 2 diabetes mellitus without complications: Secondary | ICD-10-CM | POA: Diagnosis not present

## 2021-11-26 DIAGNOSIS — M79674 Pain in right toe(s): Secondary | ICD-10-CM | POA: Diagnosis not present

## 2021-11-26 DIAGNOSIS — B351 Tinea unguium: Secondary | ICD-10-CM

## 2021-12-02 NOTE — Progress Notes (Signed)
Subjective: 81 y.o. returns the office today for painful, elongated, thickened toenails which he cannot trim himself. Denies any ulcerations or open lesions.  Denies any systemic complaints such as fevers, chills, nausea, vomiting.   PCP: Chesley Noon, MD-last seen September 11, 2021  Last A1c 6.6 on 04/17/2021  Objective: AAO 3, NAD DP/PT pulses palpable, CRT less than 3 seconds Nails hypertrophic, dystrophic, elongated, brittle, discolored 10. There is tenderness overlying the nails 1-5 bilaterally. There is no surrounding erythema or drainage along the nail sites. No open lesion identified bilaterally. Overall unchanged.  No pain with calf compression, swelling, warmth, erythema.  Assessment: Patient presents with symptomatic onychomycosis; pre-ulcerative areas bilaterally  Plan: -Treatment options including alternatives, risks, complications were discussed -Nails sharply debrided 10 without complication/bleeding. -Recommend moisturizer daily and offloading. -Continue with daily foot inspection. -Follow-up as scheduled or sooner if any problems are to arise. In the meantime, encouraged to call the office with any questions, concerns, changes symptoms.  Celesta Gentile, DPMtrim

## 2021-12-27 ENCOUNTER — Telehealth: Payer: Self-pay

## 2021-12-27 NOTE — Telephone Encounter (Signed)
Informed pt/wife that LDCT was not approved by insurance due to age 81 years.  Patient will discuss with PCP at next visit regarding ineligibility for LDCT to see if there is any other recommendations for follow up, such as diagnostic CT chest.   Acknowledged understanding

## 2021-12-30 ENCOUNTER — Ambulatory Visit (HOSPITAL_COMMUNITY): Payer: Medicare HMO

## 2022-02-08 IMAGING — CT CT CHEST LUNG CANCER SCREENING LOW DOSE W/O CM
1 series · 10 of 10 positions shown, 13 images · non-contrast
Comparison: 11/07/2019.

CLINICAL DATA: Current smoker, 49 pack-year history.

EXAM:
CT CHEST WITHOUT CONTRAST LOW-DOSE FOR LUNG CANCER SCREENING
TECHNIQUE: Multidetector CT imaging of the chest was performed following the
standard protocol without IV contrast.

[ct lung segmentation data · axial · 0.69mm/px · z∈[-348,-348]mm · 10 of 326 frames shown]
[frame 1/326  mediastinal]
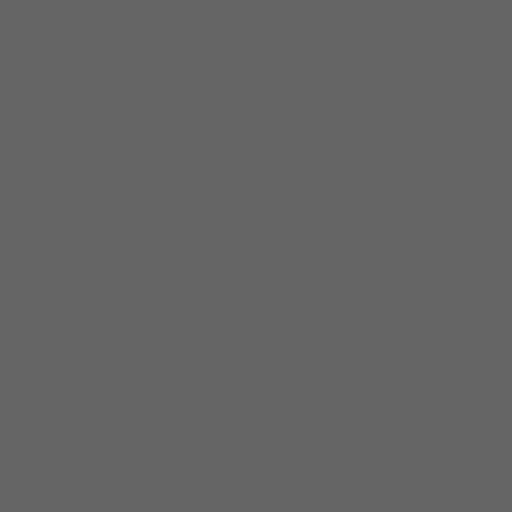
[frame 1/326  lung]
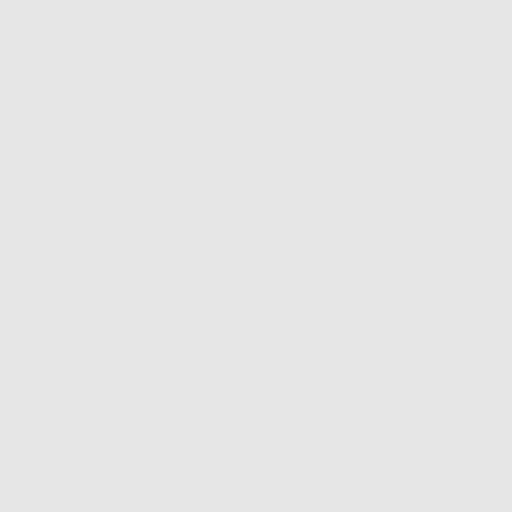
[frame 37/326  lung]
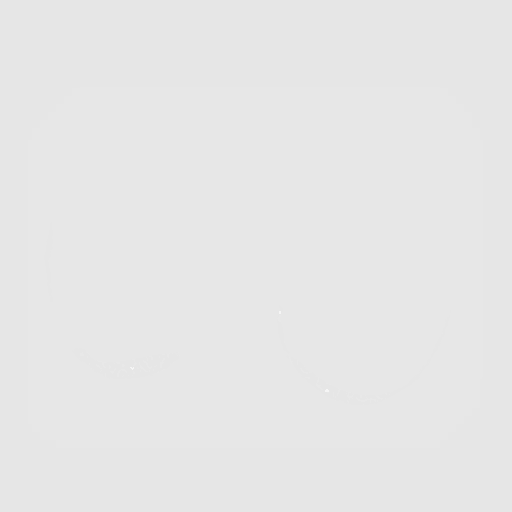
[frame 73/326  lung]
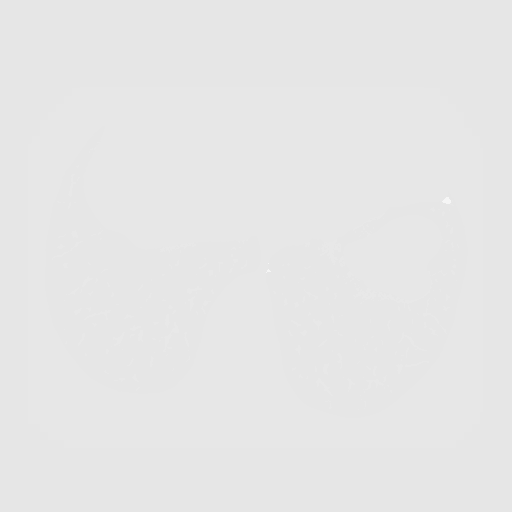
[frame 109/326  lung]
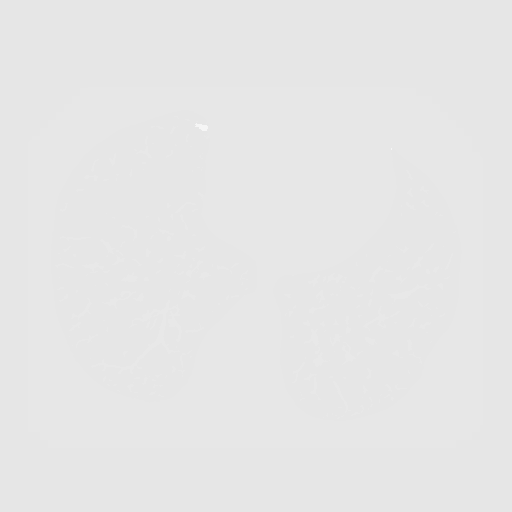
[frame 145/326  mediastinal]
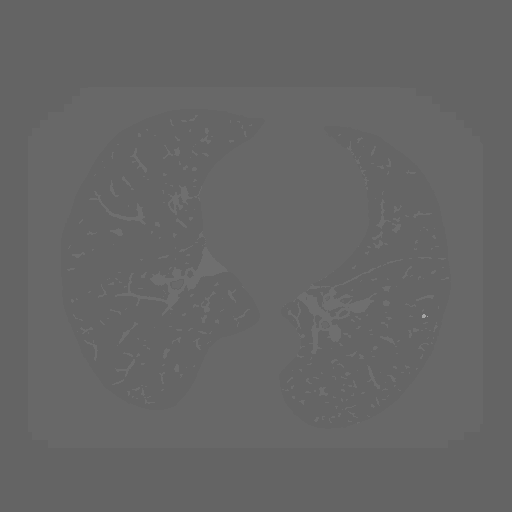
[frame 145/326  lung]
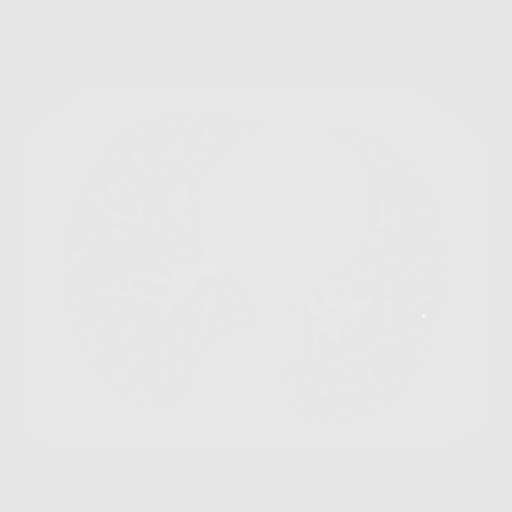
[frame 181/326  lung]
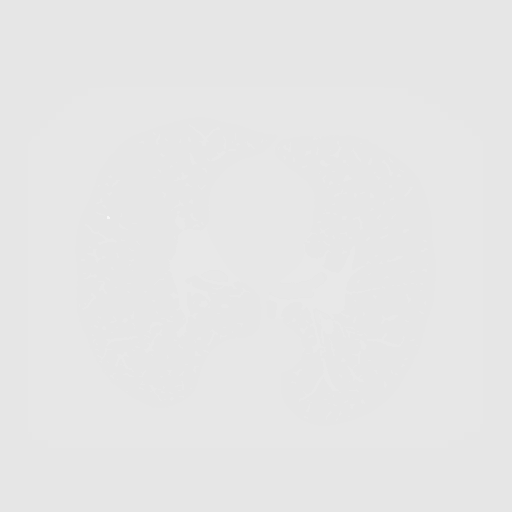
[frame 217/326  lung]
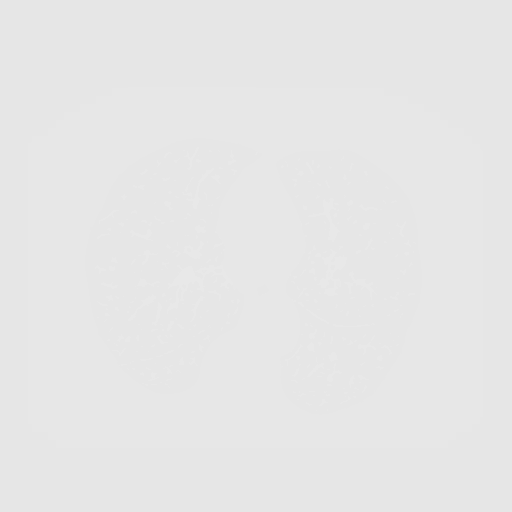
[frame 253/326  lung]
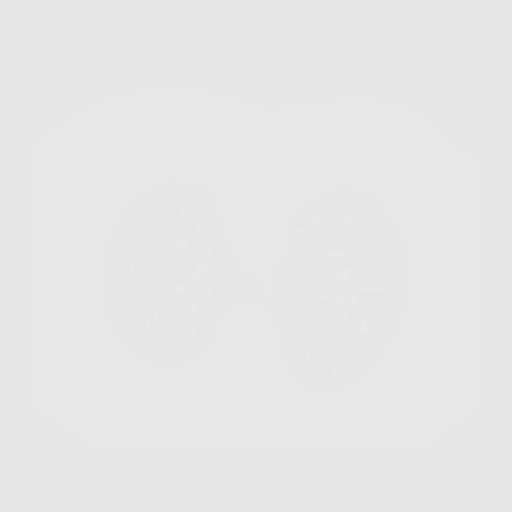
[frame 289/326  mediastinal]
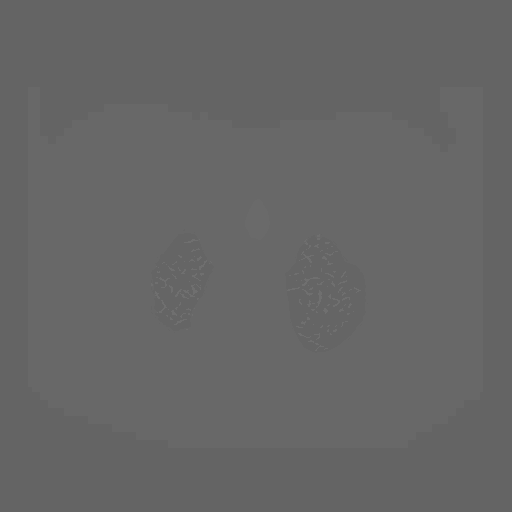
[frame 289/326  lung]
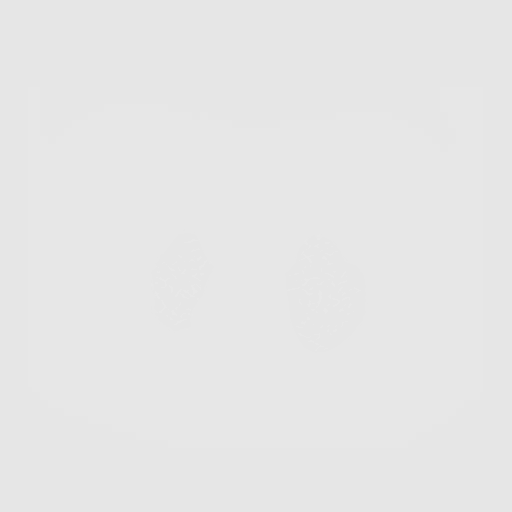
[frame 326/326  lung]
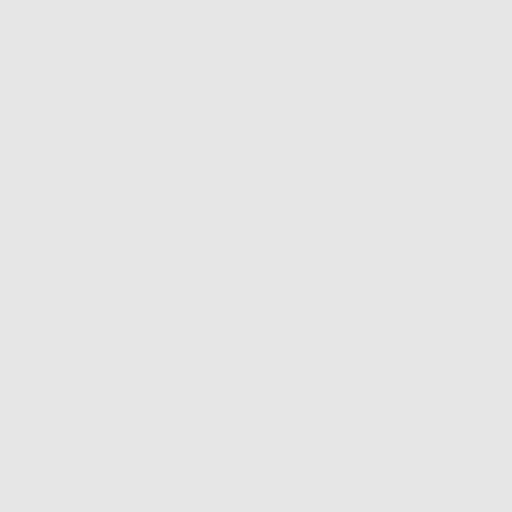

[10 of 10 positions shown; findings below may reference images not displayed]

FINDINGS: Cardiovascular: Atherosclerotic calcification of the aorta, aortic
valve and coronary arteries. Heart size normal. No pericardial
effusion.

Mediastinum/Nodes: No pathologically enlarged mediastinal or
axillary lymph nodes. Calcified mediastinal lymph nodes. Hilar
regions are difficult to evaluate without IV contrast but appear
grossly unremarkable. Esophagus is grossly unremarkable.

Lungs/Pleura: Centrilobular and paraseptal emphysema. Calcified left
lower lobe granuloma. 2.4 mm posterior right lower lobe nodule,
unchanged. No new or suspicious pulmonary nodules. No pleural fluid.
Airway is unremarkable.

Upper Abdomen: Visualized portions of the liver, gallbladder,
adrenal glands, kidneys, spleen, pancreas, stomach and bowel are
grossly unremarkable.

Musculoskeletal: Degenerative changes in the spine. Flowing anterior
osteophytosis in the thoracic spine. No worrisome lytic or sclerotic
lesions.
IMPRESSION: 1. Lung-RADS 2, benign appearance or behavior. Continue annual
screening with low-dose chest CT without contrast in 12 months.
2. Aortic atherosclerosis (GLENV-HEY.Y). Coronary artery
calcification.
3.  Emphysema (GLENV-Y7D.A).

## 2022-02-28 ENCOUNTER — Ambulatory Visit (INDEPENDENT_AMBULATORY_CARE_PROVIDER_SITE_OTHER): Payer: Medicare HMO | Admitting: Podiatry

## 2022-02-28 DIAGNOSIS — E119 Type 2 diabetes mellitus without complications: Secondary | ICD-10-CM

## 2022-02-28 DIAGNOSIS — B351 Tinea unguium: Secondary | ICD-10-CM

## 2022-02-28 DIAGNOSIS — M79674 Pain in right toe(s): Secondary | ICD-10-CM

## 2022-02-28 DIAGNOSIS — M79675 Pain in left toe(s): Secondary | ICD-10-CM

## 2022-03-03 NOTE — Progress Notes (Signed)
Subjective: 82 y.o. returns the office today for painful, elongated, thickened toenails which he cannot trim himself. Denies any ulcerations or open lesions.  Denies any systemic complaints such as fevers, chills, nausea, vomiting.   PCP: Chesley Noon, MD-last seen 12/12/2021  Last A1c 6.7 on 02/11/2021  Objective: AAO 3, NAD DP/PT pulses palpable, CRT less than 3 seconds Nails hypertrophic, dystrophic, elongated, brittle, discolored 10. There is tenderness overlying the nails 1-5 bilaterally. There is no surrounding erythema or drainage along the nail sites. No open lesion identified bilaterally. Overall unchanged.  No pain with calf compression, swelling, warmth, erythema.  Assessment: Patient presents with symptomatic onychomycosis; pre-ulcerative areas bilaterally  Plan: -Treatment options including alternatives, risks, complications were discussed -Nails sharply debrided 10 without complication/bleeding. -Recommend moisturizer daily and offloading. -Continue with daily foot inspection. -Follow-up as scheduled or sooner if any problems are to arise. In the meantime, encouraged to call the office with any questions, concerns, changes symptoms.  Celesta Gentile, DPMtrim

## 2022-06-06 ENCOUNTER — Ambulatory Visit (INDEPENDENT_AMBULATORY_CARE_PROVIDER_SITE_OTHER): Payer: Medicare Other | Admitting: Podiatry

## 2022-06-06 ENCOUNTER — Encounter: Payer: Self-pay | Admitting: Podiatry

## 2022-06-06 DIAGNOSIS — E119 Type 2 diabetes mellitus without complications: Secondary | ICD-10-CM

## 2022-06-06 DIAGNOSIS — B351 Tinea unguium: Secondary | ICD-10-CM | POA: Diagnosis not present

## 2022-06-06 DIAGNOSIS — M79674 Pain in right toe(s): Secondary | ICD-10-CM | POA: Diagnosis not present

## 2022-06-06 DIAGNOSIS — M79675 Pain in left toe(s): Secondary | ICD-10-CM

## 2022-06-07 NOTE — Progress Notes (Signed)
Subjective: Chief Complaint  Patient presents with   Debridement    Trim toenails-diabetic - last A1c was 6.6    82 y.o. returns the office today for painful, elongated, thickened toenails which he cannot trim himself. Denies any ulcerations or open lesions.  Denies any systemic complaints such as fevers, chills, nausea, vomiting.   PCP: [name], MD-last seen 03/28/2022  Last A1c 6.7 on 12/12/2021  Objective: AAO 3, NAD DP/PT pulses palpable, CRT less than 3 seconds Nails hypertrophic, dystrophic, elongated, brittle, discolored 10. There is tenderness overlying the nails 1-5 bilaterally. There is no surrounding erythema or drainage along the nail sites. No open lesion identified bilaterally. Overall unchanged.  No pain with calf compression, swelling, warmth, erythema.  Assessment: Patient presents with symptomatic onychomycosis; pre-ulcerative areas bilaterally  Plan: -Treatment options including alternatives, risks, complications were discussed -Nails sharply debrided 10 without complication/bleeding. -Recommend moisturizer daily and offloading. -Continue with daily foot inspection. -Follow-up as scheduled or sooner if any problems are to arise. In the meantime, encouraged to call the office with any questions, concerns, changes symptoms.  Ovid Curd, DPMtrim

## 2022-07-18 DIAGNOSIS — Z7729 Contact with and (suspected ) exposure to other hazardous substances: Secondary | ICD-10-CM | POA: Insufficient documentation

## 2022-08-04 LAB — COLOGUARD: COLOGUARD: NEGATIVE

## 2022-09-04 ENCOUNTER — Ambulatory Visit: Payer: Medicare Other | Admitting: Podiatry

## 2022-09-05 ENCOUNTER — Ambulatory Visit: Payer: Medicare Other | Admitting: Podiatry

## 2022-09-29 ENCOUNTER — Encounter: Payer: Self-pay | Admitting: Podiatry

## 2022-09-29 ENCOUNTER — Ambulatory Visit (INDEPENDENT_AMBULATORY_CARE_PROVIDER_SITE_OTHER): Payer: Medicare HMO | Admitting: Podiatry

## 2022-09-29 DIAGNOSIS — M79675 Pain in left toe(s): Secondary | ICD-10-CM | POA: Diagnosis not present

## 2022-09-29 DIAGNOSIS — H903 Sensorineural hearing loss, bilateral: Secondary | ICD-10-CM | POA: Insufficient documentation

## 2022-09-29 DIAGNOSIS — E119 Type 2 diabetes mellitus without complications: Secondary | ICD-10-CM

## 2022-09-29 DIAGNOSIS — B351 Tinea unguium: Secondary | ICD-10-CM | POA: Diagnosis not present

## 2022-09-29 DIAGNOSIS — M79674 Pain in right toe(s): Secondary | ICD-10-CM | POA: Diagnosis not present

## 2022-09-29 DIAGNOSIS — Z461 Encounter for fitting and adjustment of hearing aid: Secondary | ICD-10-CM | POA: Insufficient documentation

## 2022-09-29 NOTE — Progress Notes (Signed)
Subjective: Chief Complaint  Patient presents with   Nail Problem    Nail trim     82 y.o. returns the office today for painful, elongated, thickened toenails which he cannot trim himself. Denies any ulcerations or open lesions.  Denies any systemic complaints such as fevers, chills, nausea, vomiting.   PCP: DR. Patrecia Pour, PA-C Last seen: 07/18/2022  Last A1c 6.7 on 12/12/2021  Objective: AAO 3, NAD DP/PT pulses palpable, CRT less than 3 seconds Nails hypertrophic, dystrophic, elongated, brittle, discolored 10. There is tenderness overlying the nails 1-5 bilaterally. There is no surrounding erythema or drainage along the nail sites. No open lesion identified bilaterally. No pain with calf compression, swelling, warmth, erythema.  Assessment: Patient presents with symptomatic onychomycosis; pre-ulcerative areas bilaterally  Plan: -Treatment options including alternatives, risks, complications were discussed -Nails sharply debrided 10 without complication/bleeding. -Recommend moisturizer daily and offloading. -Continue with daily foot inspection. -Follow-up as scheduled or sooner if any problems are to arise. In the meantime, encouraged to call the office with any questions, concerns, changes symptoms.  Ovid Curd, DPMtrim

## 2022-10-17 ENCOUNTER — Ambulatory Visit (INDEPENDENT_AMBULATORY_CARE_PROVIDER_SITE_OTHER): Payer: Medicare HMO | Admitting: Primary Care

## 2022-10-17 ENCOUNTER — Encounter: Payer: Self-pay | Admitting: Primary Care

## 2022-10-17 VITALS — BP 138/80 | HR 76 | Ht 74.0 in | Wt 212.4 lb

## 2022-10-17 DIAGNOSIS — J432 Centrilobular emphysema: Secondary | ICD-10-CM

## 2022-10-17 DIAGNOSIS — G4733 Obstructive sleep apnea (adult) (pediatric): Secondary | ICD-10-CM | POA: Diagnosis not present

## 2022-10-17 NOTE — Assessment & Plan Note (Signed)
-   Chronic cough, no dyspnea  - Centrilobular and paraseptal emphysema on CT imaging - Trial Spiriva Respimat 2.2mcg two puff daily x 2 weeks, if beneficial advised patient notify offivce and we will send in RX

## 2022-10-17 NOTE — Progress Notes (Signed)
@Patient  ID: Matthew Hooper, male    DOB: [birth year], 82 y.o.   MRN: [id number]  Chief Complaint  Patient presents with   Consult    Referring provider: [name], MD  HPI: 82 year old MALE, current smoker. PMH significant for OSA, tobacco abuse. Patient of Dr. Vassie Loll. HST 08/2017 showed moderate OSA with 18/h. Maintained on auto CPAP 5-15cm h20.   Previous [facility] pulmonary encounter: 09/07/2018 Patient presents today for OSA 6 month follow-up. He is doing well, no complaints. Moderate compliance with CPAP. States that he forgets to put mask on a few times because he falls asleep on the couch. He reports sleeping well at night with less snoring. Feels well rested when he wakes up in the morning.   Airview download:  14/30 days (47%); 14 days >4 hours.  Pressure 5-15cm H20 (12.2) AHI 2.2   03/15/2019 Patient presents today for 6 month OSA follow-up. He is doing well, no acute complaints. He is mostly compliant with CPAP. States that he has had early doctors appointment and had recent family member pass away. No issues with machine, mask fit or pressure setting. Sleeping well through the night with less snoring. Takes naps during the day but does not use CPAP then. No trouble breathing. He is still smoking 0.75 ppd, he would like to quit and states that the [place] gave him nicotine patches. His wife is his support system. Denies shortness of breath, chest tightness or wheezing.   Airview download 02/11/19-03/12/19: Usage 12/30 days; 12 days > 4 hours Average usage days used- 6 hours 14 mins Pressure 5-15cm h20 (14.5- 95%) Leaks 28.9L/min AHI 3.2   11/10/2019 Patient presents today for 28-month follow-up CT imaging and OSA. Patient is current smoker, he is apart of our lung cancer screening program. He had annual low-dose CT chest on 11/07/2019 that showed lung RADS 2, right upper lobe pulmonary nodule 2.6 mm which is not significantly changed. Discussed smoking cessation with him today.   In regards  to his sleep apnea he remains moderately compliant with CPAP. No issues with pressure setting or mask fit. He is sleeping well, gets approx 7 hours at night. He tends to not wear his CPAP on days when he needs to bring his wife to doctors apt the following day. States that he sleeps so well with CPAP he is worried about over sleeping or not waking up at night if she needs him. He drives his wife on Wednesday to clinic in [place] for wound check/dressing change lower on her lower extremity.   Airview download 10/10/19-11/08/19: Usage days 17/30 (57%) Average usage 5 hours 46 mins  Pressure 5-15cm h20 (13.4cm h20-95%) Leaks 21.5L/min (95%) AHI 1.9    10/17/2022- Interim hx  Patient presents today for overdue follow-up OSA. CPAP compliance in the past had been inconsistent at times. He reports sleeping so well when he wears his CPAP that he is worried about over sleeping or not waking up at night if his wife needs him. He gets approximately 7 hours of sleep a night. Snoring symptoms are keeping his wife awake at night. He has not worn his CPAP in the last 3-4 months. His machine is > 23 years old and stopped working. No issues with pressure setting.  Bedtime is 1:30 AM. It takes him 5-83mins to fall asleep. Starts his day at 9am. Weight is down 15 lbs. Epworth score 5/24.   He has a chronic cough. No shortness of breath. Able to do all ADLs.  Current smoker. He was following with lung cancer screening program up until 2022, no longer qualifies for screening program due to age. LDCT 12/28/20 >> showed lung RADS 2, benign appearance or behavior. Emphysema.    Imaging: LDCT 10/29/18 >> Lung- RADS 2 (08/2017 - RADS 1S).  LDCT 11/07/19 >> Lung - RADS 2, benign appearance or behavior  LDCT 12/28/20 >> Lung- RADS2, benign appearance or behavior. Centrilobular and paraseptal emphysema. Calcified left lower lobe granuloma. 2.4 mm posterior right lower lobe nodule, unchanged. No new or suspicious pulmonary  nodules.    No Known Allergies  Immunization History  Administered Date(s) Administered   COVID-19, mRNA, vaccine(Comirnaty)12 years and older 01/31/2022   Fluad Quad(high Dose 65+) 11/11/2018   Influenza, High Dose Seasonal PF 11/02/2015, 01/10/2017   Influenza,inj,Quad PF,6+ Mos 11/10/2017   Influenza-Unspecified 11/10/2017   PFIZER(Purple Top)SARS-COV-2 Vaccination 03/26/2019, 04/18/2019, 12/12/2019, 06/25/2020   Pfizer Covid-19 Vaccine Bivalent Booster 54yrs & up 12/04/2020, 09/02/2021   Pneumococcal Conjugate-13 10/10/2016   Pneumococcal Polysaccharide-23 01/10/2015    Past Medical History:  Diagnosis Date   Diabetes mellitus without complication (HCC)    Glaucoma 04/17/2021   Hyperlipidemia    Hypertension     Tobacco History: Social History   Tobacco Use  Smoking Status Every Day   Current packs/day: 0.75   Average packs/day: 0.8 packs/day for 62.0 years (46.5 ttl pk-yrs)   Types: Cigarettes  Smokeless Tobacco Never  Tobacco Comments   1/2 per day    Ready to quit: Not Answered Counseling given: Not Answered Tobacco comments: 1/2 per day    Outpatient Medications Prior to Visit  Medication Sig Dispense Refill   aspirin 81 MG EC tablet Take 1 tablet by mouth daily.     COSOPT 2-0.5 % ophthalmic solution 1 drop 2 (two) times daily.     D3-50 1.25 MG (50000 UT) capsule Take 50,000 Units by mouth daily.     dorzolamide (TRUSOPT) 2 % ophthalmic solution Place 1 drop into both eyes every evening.     Lancets (ONETOUCH DELICA PLUS LANCET33G) MISC Apply 1 each topically daily.     latanoprost (XALATAN) 0.005 % ophthalmic solution Place 1 drop into both eyes at bedtime.     metFORMIN (GLUCOPHAGE) 1000 MG tablet Take 1,000 mg by mouth 2 (two) times daily with a meal.     pravastatin (PRAVACHOL) 80 MG tablet Take 80 mg by mouth at bedtime.     valsartan (DIOVAN) 160 MG tablet Take 160 mg by mouth daily.      No facility-administered medications prior to visit.    Review of Systems  Review of Systems  Constitutional: Negative.   HENT: Negative.    Respiratory:  Positive for cough. Negative for shortness of breath and wheezing.   Cardiovascular: Negative.    Physical Exam  BP 138/80 (BP Location: Left Arm, Cuff Size: Normal)   Pulse 76   Ht 6\' 2"  (1.88 m)   Wt 212 lb 6.4 oz (96.3 kg)   SpO2 100%   BMI 27.27 kg/m  Physical Exam Constitutional:      General: He is not in acute distress.    Appearance: Normal appearance. He is not ill-appearing.  HENT:     Head: Normocephalic and atraumatic.     Mouth/Throat:     Mouth: Mucous membranes are moist.     Pharynx: Oropharynx is clear.  Cardiovascular:     Rate and Rhythm: Normal rate and regular rhythm.  Pulmonary:     Effort: Pulmonary effort  is normal.     Breath sounds: Normal breath sounds.     Comments: CTA Musculoskeletal:        General: Normal range of motion.  Skin:    General: Skin is warm and dry.  Neurological:     General: No focal deficit present.     Mental Status: He is alert and oriented to person, place, and time. Mental status is at baseline.  Psychiatric:        Mood and Affect: Mood normal.        Behavior: Behavior normal.        Thought Content: Thought content normal.        Judgment: Judgment normal.      Lab Results:  CBC    Component Value Date/Time   WBC 4.9 04/17/2021 0800   RBC 4.82 04/17/2021 0800   HGB 13.1 04/17/2021 0800   HCT 39.3 04/17/2021 0800   PLT 167 04/17/2021 0800   MCV 81.5 04/17/2021 0800   MCH 27.2 04/17/2021 0800   MCHC 33.3 04/17/2021 0800   RDW 15.4 04/17/2021 0800   LYMPHSABS 0.9 04/17/2021 0800   MONOABS 0.8 04/17/2021 0800   EOSABS 0.0 04/17/2021 0800   BASOSABS 0.0 04/17/2021 0800    BMET    Component Value Date/Time   NA 136 04/17/2021 0800   K 3.4 (L) 04/17/2021 0800   CL 103 04/17/2021 0800   CO2 25 04/17/2021 0800   GLUCOSE 121 (H) 04/17/2021 0800   BUN 14 04/17/2021 0800   CREATININE 1.09  04/17/2021 0800   CALCIUM 8.5 (L) 04/17/2021 0800   GFRNONAA >60 04/17/2021 0800    BNP No results found for: "BNP"  ProBNP No results found for: "PROBNP"  Imaging: No results found.   Assessment & Plan:   OSA (obstructive sleep apnea) - HST 08/2017 showed moderate OSA with 18/h. Maintained on auto CPAP 5-15cm h20. CPAP stopped working 3-4 months ago. Needs order for new machine. No changes recommended to pressure settings. DME company is [employer]. FU in 3 months for compliance check.   Centrilobular emphysema (HCC) - Chronic cough, no dyspnea  - Centrilobular and paraseptal emphysema on CT imaging - Trial Spiriva Respimat 2.74mcg two puff daily x 2 weeks, if beneficial advised patient notify offivce and we will send in RX   Glenford Bayley, NP 10/17/2022

## 2022-10-17 NOTE — Patient Instructions (Signed)
Recommendations: - Start Spiriva respimat 2.18mcg - take two puffs daily every morning x 2 weeks (sample given- if beneficial notify office and we can send in prescription)   - Once you get your new CPAP machine, please aim to wear EVERY night 4-6 hours or longer   - Cut back on smoking and pick quit date.   - Stay as active as possible   Orders: - DME order new CPAP 1-15cm h20    Follow-up: - 3 months with BETH NP for CPAP compliance check

## 2022-10-17 NOTE — Assessment & Plan Note (Addendum)
-   HST 08/2017 showed moderate OSA with 18/h. Maintained on auto CPAP 5-15cm h20. CPAP stopped working 3-4 months ago. Needs order for new machine. No changes recommended to pressure settings. DME company is [employer]. FU in 3 months for compliance check.

## 2022-12-30 ENCOUNTER — Encounter: Payer: Self-pay | Admitting: Podiatry

## 2022-12-30 ENCOUNTER — Ambulatory Visit (INDEPENDENT_AMBULATORY_CARE_PROVIDER_SITE_OTHER): Payer: Medicare HMO | Admitting: Podiatry

## 2022-12-30 DIAGNOSIS — E119 Type 2 diabetes mellitus without complications: Secondary | ICD-10-CM | POA: Diagnosis not present

## 2022-12-30 DIAGNOSIS — M79674 Pain in right toe(s): Secondary | ICD-10-CM | POA: Diagnosis not present

## 2022-12-30 DIAGNOSIS — B351 Tinea unguium: Secondary | ICD-10-CM

## 2022-12-30 DIAGNOSIS — M79675 Pain in left toe(s): Secondary | ICD-10-CM | POA: Diagnosis not present

## 2022-12-30 NOTE — Progress Notes (Signed)
Subjective: Chief Complaint  Patient presents with   [facility]    RM#[facility] patient has no concerns at this time.    82 y.o. returns the office today for painful, elongated, thickened toenails which he cannot trim himself. Denies any ulcerations or open lesions.  Denies any systemic complaints such as fevers, chills, nausea, vomiting.   PCP: DR. Patrecia Pour, PA-C Last seen: 07/18/2022  Last A1c 6.7 on 12/12/2021  Objective: AAO 3, NAD DP/PT pulses palpable, CRT less than 3 seconds Nails hypertrophic, dystrophic, elongated, brittle, discolored 10. There is tenderness overlying the nails 1-5 bilaterally. There is no surrounding erythema or drainage along the nail sites. No open lesion identified bilaterally. No pain with calf compression, swelling, warmth, erythema.  Assessment: Patient presents with symptomatic onychomycosis; pre-ulcerative areas bilaterally  Plan: -Treatment options including alternatives, risks, complications were discussed -Nails sharply debrided 10 without complication/bleeding. -Recommend moisturizer daily and offloading. -Continue with daily foot inspection. -Follow-up as scheduled or sooner if any problems are to arise. In the meantime, encouraged to call the office with any questions, concerns, changes symptoms.  Return in about 3 months (around 04/01/2023).  Ovid Curd, DPMtrim

## 2023-01-16 ENCOUNTER — Encounter: Payer: Self-pay | Admitting: Primary Care

## 2023-01-16 ENCOUNTER — Ambulatory Visit: Payer: Medicare HMO | Admitting: Primary Care

## 2023-01-16 VITALS — BP 140/70 | HR 65 | Temp 97.7°F | Ht 74.5 in | Wt 210.0 lb

## 2023-01-16 DIAGNOSIS — G4733 Obstructive sleep apnea (adult) (pediatric): Secondary | ICD-10-CM | POA: Diagnosis not present

## 2023-01-16 NOTE — Patient Instructions (Addendum)
-OBSTRUCTIVE SLEEP APNEA: Obstructive sleep apnea is a condition where your breathing stops and starts repeatedly during sleep. We will adjust the pressure settings on your CPAP machine to further reduce the number of apneic events. Please continue using the machine every night and follow up in 2-3 months to assess the effectiveness of the new settings.  -TOBACCO USE: Smoking can cause serious health issues, including emphysema and lung cancer. You currently smoke about half a pack per day and have considered quitting. We encourage you to continue exploring cessation strategies to improve your overall health. Call 1-800-quit-now for free nicotine patches   -EMPHYSEMA: Emphysema is a lung condition that causes shortness of breath. You reported no bothersome respiratory symptoms, so we will discontinue the inhaler that was previously provided. Please continue to monitor your symptoms and report any changes.  -LUNG CANCER SCREENING: Your previous CT scans showed no suspicious pulmonary nodules, and you have aged out of the lung cancer screening program. No further action is required at this time.  Orders: Change CPAP pressure 9cm h20   Follow-up: 2-3 months with Beth NP for CPAP check in    Steps to Quit Smoking Smoking tobacco is the leading cause of preventable death. It can affect almost every organ in the body. Smoking puts you and those around you at risk for developing many serious chronic diseases. Quitting smoking can be very challenging. Do not get discouraged if you are not successful the first time. Some people need to make many attempts to quit before they achieve long-term success. Do your best to stick to your quit plan, and talk with your health care provider if you have any questions or concerns. How do I get ready to quit? When you decide to quit smoking, create a plan to help you succeed. Before you quit: Pick a date to quit. Set a date within the next 2 weeks to give you time to  prepare. Write down the reasons why you are quitting. Keep this list in places where you will see it often. Tell your family, friends, and co-workers that you are quitting. Support from people you are close to can make quitting easier. Talk with your health care provider about your options for quitting smoking. Find out what treatment options are covered by your health insurance. Identify people, places, things, and activities that make you want to smoke (triggers). Avoid them. What first steps can I take to quit smoking? Throw away all cigarettes at home, at work, and in your car. Throw away smoking accessories, such as Set designer. Clean your car. Make sure to empty the ashtray. Clean your home, including curtains and carpets. What strategies can I use to quit smoking? Talk with your health care provider about combining strategies, such as taking medicines while you are also receiving in-person counseling. Using these two strategies together makes you more likely to succeed in quitting than if you used either strategy on its own. If you are pregnant or breastfeeding, talk with your health care provider about finding counseling or other support strategies to quit smoking. Do not take medicine to help you quit smoking unless your health care provider tells you to. Quit right away Quit smoking completely, instead of gradually reducing how much you smoke over a period of time. Stopping smoking right away may be more successful than gradually quitting. Attend in-person counseling to help you build problem-solving skills. You are more likely to succeed in quitting if you attend counseling sessions regularly. Even short sessions  of 10 minutes can be effective. Take medicine You may take medicines to help you quit smoking. Some medicines require a prescription. You can also purchase over-the-counter medicines. Medicines may have nicotine in them to replace the nicotine in cigarettes. Medicines  may: Help to stop cravings. Help to relieve withdrawal symptoms. Your health care provider may recommend: Nicotine patches, gum, or lozenges. Nicotine inhalers or sprays. Non-nicotine medicine that you take by mouth. Find resources Find resources and support systems that can help you quit smoking and remain smoke-free after you quit. These resources are most helpful when you use them often. They include: Online chats with a Veterinary surgeon. Telephone quitlines. Printed Materials engineer. Support groups or group counseling. Text messaging programs. Mobile phone apps or applications. Use apps that can help you stick to your quit plan by providing reminders, tips, and encouragement. Examples of free services include Quit Guide from the CDC and smokefree.gov  What can I do to make it easier to quit?  Reach out to your family and friends for support and encouragement. Call telephone quitlines, such as 1-800-QUIT-NOW, reach out to support groups, or work with a counselor for support. Ask people who smoke to avoid smoking around you. Avoid places that trigger you to smoke, such as bars, parties, or smoke-break areas at work. Spend time with people who do not smoke. Lessen the stress in your life. Stress can be a smoking trigger for some people. To lessen stress, try: Exercising regularly. Doing deep-breathing exercises. Doing yoga. Meditating. What benefits will I see if I quit smoking? Over time, you should start to see positive results, such as: Improved sense of smell and taste. Decreased coughing and sore throat. Slower heart rate. Lower blood pressure. Clearer and healthier skin. The ability to breathe more easily. Fewer sick days. Summary Quitting smoking can be very challenging. Do not get discouraged if you are not successful the first time. Some people need to make many attempts to quit before they achieve long-term success. When you decide to quit smoking, create a plan to help  you succeed. Quit smoking right away, not slowly over a period of time. Find resources and support systems that can help you quit smoking and remain smoke-free after you quit. This information is not intended to replace advice given to you by your health care provider. Make sure you discuss any questions you have with your health care provider. Document Revised: 01/18/2021 Document Reviewed: 01/18/2021 Elsevier Patient Education  2024 [employer].

## 2023-01-16 NOTE — Progress Notes (Signed)
@Patient  ID: Matthew Hooper, male    DOB: 11-May-1940, 82 y.o.   MRN: [id number]  No chief complaint on file.   Referring provider: [name], MD  HPI: 82 year old MALE, current smoker. PMH significant for OSA, tobacco abuse. Patient of Dr. Vassie Loll. HST 08/2017 showed moderate OSA with 18/h. Maintained on auto CPAP 5-15cm h20.   Previous [facility] pulmonary encounter: 09/07/2018 Patient presents today for OSA 6 month follow-up. He is doing well, no complaints. Moderate compliance with CPAP. States that he forgets to put mask on a few times because he falls asleep on the couch. He reports sleeping well at night with less snoring. Feels well rested when he wakes up in the morning.   Airview download:  14/30 days (47%); 14 days >4 hours.  Pressure 5-15cm H20 (12.2) AHI 2.2   03/15/2019 Patient presents today for 6 month OSA follow-up. He is doing well, no acute complaints. He is mostly compliant with CPAP. States that he has had early doctors appointment and had recent family member pass away. No issues with machine, mask fit or pressure setting. Sleeping well through the night with less snoring. Takes naps during the day but does not use CPAP then. No trouble breathing. He is still smoking 0.75 ppd, he would like to quit and states that the [place] gave him nicotine patches. His wife is his support system. Denies shortness of breath, chest tightness or wheezing.   Airview download 02/11/19-03/12/19: Usage 12/30 days; 12 days > 4 hours Average usage days used- 6 hours 14 mins Pressure 5-15cm h20 (14.5- 95%) Leaks 28.9L/min AHI 3.2   11/10/2019 Patient presents today for 76-month follow-up CT imaging and OSA. Patient is current smoker, he is apart of our lung cancer screening program. He had annual low-dose CT chest on 11/07/2019 that showed lung RADS 2, right upper lobe pulmonary nodule 2.6 mm which is not significantly changed. Discussed smoking cessation with him today.   In regards to his sleep apnea he  remains moderately compliant with CPAP. No issues with pressure setting or mask fit. He is sleeping well, gets approx 7 hours at night. He tends to not wear his CPAP on days when he needs to bring his wife to doctors apt the following day. States that he sleeps so well with CPAP he is worried about over sleeping or not waking up at night if she needs him. He drives his wife on Wednesday to clinic in [place] for wound check/dressing change lower on her lower extremity.   Airview download 10/10/19-11/08/19: Usage days 17/30 (57%) Average usage 5 hours 46 mins  Pressure 5-15cm h20 (13.4cm h20-95%) Leaks 21.5L/min (95%) AHI 1.9   10/17/2022 Patient presents today for overdue follow-up OSA. CPAP compliance in the past had been inconsistent at times. He reports sleeping so well when he wears his CPAP that he is worried about over sleeping or not waking up at night if his wife needs him. He gets approximately 7 hours of sleep a night. Snoring symptoms are keeping his wife awake at night. He has not worn his CPAP in the last 3-4 months. His machine is > 67 years old and stopped working. No issues with pressure setting.  Bedtime is 1:30 AM. It takes him 5-61mins to fall asleep. Starts his day at 9am. Weight is down 15 lbs. Epworth score 5/24.   He has a chronic cough. No shortness of breath. Able to do all ADLs. Current smoker. He was following with lung cancer screening  program up until 2022, no longer qualifies for screening program due to age. LDCT 12/28/20 >> showed lung RADS 2, benign appearance or behavior. Emphysema.   OSA (obstructive sleep apnea) - HST 08/2017 showed moderate OSA with 18/h. Maintained on auto CPAP 5-15cm h20. CPAP stopped working 3-4 months ago. Needs order for new machine. No changes recommended to pressure settings. DME company is [employer]. FU in 3 months for compliance check.    Centrilobular emphysema (HCC) - Chronic cough, no dyspnea  - Centrilobular and paraseptal emphysema  on CT imaging - Trial Spiriva Respimat 2.12mcg two puff daily x 2 weeks, if beneficial advised patient notify offivce and we will send in RX    01/16/2023 - Interim hx  Discussed the use of AI scribe software for clinical note transcription with the patient, who gave verbal consent to proceed.  History of Present Illness   The patient, with a history of sleep apnea, emphysema, and tobacco abuse, presents for a follow-up visit after receiving a new CPAP machine approximately a month ago. He reports consistent nightly use of the new machine without any issues and notes an improvement in sleep quality. Despite this, the patient continues to experience some apneic events, albeit fewer than during his initial sleep study.  The patient denies any changes to his medication regimen, which includes aspirin, metformin, pravastatin, valsartan, and eye drops. He takes no sleep aids and does not drink alcohol. He denies any nocturnal symptoms such as shortness of breath and reports stable weight and absence of leg swelling.  Regarding his sleep pattern, the patient reports variable bedtime, usually around midnight or later, and wakes up between seven and nine in the morning, feeling rested. He occasionally takes daytime naps but does not use the CPAP machine during these times. He denies any instances of falling asleep while driving.  In terms of his emphysema, the patient reports an occasional cough but denies it being bothersome or interfering with his daily life. He never tried sample of Stiolto. He denies any significant respiratory symptoms. He continues to smoke, consuming approximately half a pack per day, but expresses readiness to consider quitting. He has tried patches in the past. He has been part of a lung cancer screening program and his CT scans have shown no suspicious pulmonary nodules.     Airview download 01/05/23-01/14/23 Usage 10/10 days (100%) Average usage days used 5 hours 35 mins  Pressure  5-15cm h20 (10.8cm h20-95%) Airleaks 9.5L/min (95%) Apnea index- central 5.2/obstructive 2.5 AHI 8.5   Imaging: LDCT 10/29/18 >> Lung- RADS 2 (08/2017 - RADS 1S).  LDCT 11/07/19 >> Lung - RADS 2, benign appearance or behavior  LDCT 12/28/20 >> Lung- RADS2, benign appearance or behavior. Centrilobular and paraseptal emphysema. Calcified left lower lobe granuloma. 2.4 mm posterior right lower lobe nodule, unchanged. No new or suspicious pulmonary nodules.   No Known Allergies  Immunization History  Administered Date(s) Administered   Fluad Quad(high Dose 65+) 11/11/2018   Influenza, High Dose Seasonal PF 11/02/2015, 01/10/2017   Influenza,inj,Quad PF,6+ Mos 11/10/2017   Influenza-Unspecified 11/10/2017   PFIZER(Purple Top)SARS-COV-2 Vaccination 03/26/2019, 04/18/2019, 12/12/2019, 06/25/2020   Pfizer Covid-19 Vaccine Bivalent Booster 109yrs & up 12/04/2020, 09/02/2021   Pfizer(Comirnaty)Fall Seasonal Vaccine 12 years and older 01/31/2022   Pneumococcal Conjugate-13 10/10/2016   Pneumococcal Polysaccharide-23 01/10/2015    Past Medical History:  Diagnosis Date   Diabetes mellitus without complication (HCC)    Glaucoma 04/17/2021   Hyperlipidemia    Hypertension     Tobacco  History: Social History   Tobacco Use  Smoking Status Every Day   Current packs/day: 0.75   Average packs/day: 0.8 packs/day for 62.0 years (46.5 ttl pk-yrs)   Types: Cigarettes  Smokeless Tobacco Never  Tobacco Comments   1/2 per day    Ready to quit: Not Answered Counseling given: Not Answered Tobacco comments: 1/2 per day    Outpatient Medications Prior to Visit  Medication Sig Dispense Refill   aspirin 81 MG EC tablet Take 1 tablet by mouth daily.     COSOPT 2-0.5 % ophthalmic solution 1 drop 2 (two) times daily.     D3-50 1.25 MG (50000 UT) capsule Take 50,000 Units by mouth daily.     dorzolamide (TRUSOPT) 2 % ophthalmic solution Place 1 drop into both eyes every evening.     Lancets (ONETOUCH  DELICA PLUS LANCET33G) MISC Apply 1 each topically daily.     latanoprost (XALATAN) 0.005 % ophthalmic solution Place 1 drop into both eyes at bedtime.     metFORMIN (GLUCOPHAGE) 1000 MG tablet Take 1,000 mg by mouth 2 (two) times daily with a meal.     pravastatin (PRAVACHOL) 80 MG tablet Take 80 mg by mouth at bedtime.     valsartan (DIOVAN) 160 MG tablet Take 160 mg by mouth daily.      No facility-administered medications prior to visit.    Review of Systems  Review of Systems  Constitutional: Negative.  Negative for fatigue and unexpected weight change.  HENT: Negative.    Respiratory: Negative.    Cardiovascular: Negative.   Psychiatric/Behavioral:  Negative for sleep disturbance.     Physical Exam  There were no vitals taken for this visit. Physical Exam Constitutional:      General: He is not in acute distress.    Appearance: Normal appearance. He is not ill-appearing.  HENT:     Head: Normocephalic and atraumatic.     Mouth/Throat:     Mouth: Mucous membranes are moist.     Pharynx: Oropharynx is clear.  Cardiovascular:     Rate and Rhythm: Normal rate and regular rhythm.  Pulmonary:     Effort: Pulmonary effort is normal.     Breath sounds: Normal breath sounds.  Musculoskeletal:        General: Normal range of motion.  Skin:    General: Skin is warm and dry.  Neurological:     General: No focal deficit present.     Mental Status: He is alert and oriented to person, place, and time. Mental status is at baseline.  Psychiatric:        Mood and Affect: Mood normal.        Behavior: Behavior normal.        Thought Content: Thought content normal.        Judgment: Judgment normal.      Lab Results:  CBC    Component Value Date/Time   WBC 4.9 04/17/2021 0800   RBC 4.82 04/17/2021 0800   HGB 13.1 04/17/2021 0800   HCT 39.3 04/17/2021 0800   PLT 167 04/17/2021 0800   MCV 81.5 04/17/2021 0800   MCH 27.2 04/17/2021 0800   MCHC 33.3 04/17/2021 0800   RDW  15.4 04/17/2021 0800   LYMPHSABS 0.9 04/17/2021 0800   MONOABS 0.8 04/17/2021 0800   EOSABS 0.0 04/17/2021 0800   BASOSABS 0.0 04/17/2021 0800    BMET    Component Value Date/Time   NA 136 04/17/2021 0800   K 3.4 (L)  04/17/2021 0800   CL 103 04/17/2021 0800   CO2 25 04/17/2021 0800   GLUCOSE 121 (H) 04/17/2021 0800   BUN 14 04/17/2021 0800   CREATININE 1.09 04/17/2021 0800   CALCIUM 8.5 (L) 04/17/2021 0800   GFRNONAA >60 04/17/2021 0800    BNP No results found for: "BNP"  ProBNP No results found for: "PROBNP"  Imaging: No results found.   Assessment & Plan:   1. OSA (obstructive sleep apnea) - Ambulatory Referral for DME     Moderate obstructive Sleep Apnea Patient has been using new CPAP machine for approximately a month. Current pressure 5-15cm h20; Residual AHI 8/hour. Patient reports wearing the machine every night and sleeping well. -Auto pressure settings could be contributing to breakthrough central apneas, recommend changing CPAP to set pressure 9cm h20, -Follow up in 2-3 months to assess efficacy of adjusted settings.  Tobacco Use Patient continues to smoke, consuming approximately half a pack per day. Patient has considered quitting and has previously tried patches. -Encourage patient to continue considering cessation strategies. Provided patient education and 1-800-quit-now number for nicotine patches   Emphysema Patient has a history of emphysema but reports no current respiratory symptoms. No changes in medications. Patient was previously given a sample of Stiolto but did not use it due to lack of bothersome symptoms. -Discontinue inhaler as patient reports no bothersome respiratory symptoms.  Lung Cancer Screening Patient has aged out of the screening program. Previous CT scans showed no suspicious pulmonary nodules and were unchanged. -No further action required at this time.      Glenford Bayley, NP 01/16/2023

## 2023-04-03 ENCOUNTER — Ambulatory Visit: Payer: Medicare HMO | Admitting: Podiatry

## 2023-04-13 ENCOUNTER — Emergency Department (HOSPITAL_COMMUNITY)
Admission: EM | Admit: 2023-04-13 | Discharge: 2023-04-13 | Disposition: A | Discharge status: Home / Self Care | Attending: Emergency Medicine | Admitting: Emergency Medicine

## 2023-04-13 ENCOUNTER — Emergency Department (HOSPITAL_COMMUNITY)

## 2023-04-13 ENCOUNTER — Encounter (HOSPITAL_COMMUNITY): Payer: Self-pay

## 2023-04-13 ENCOUNTER — Other Ambulatory Visit: Payer: Self-pay

## 2023-04-13 DIAGNOSIS — Z7982 Long term (current) use of aspirin: Secondary | ICD-10-CM | POA: Diagnosis not present

## 2023-04-13 DIAGNOSIS — J101 Influenza due to other identified influenza virus with other respiratory manifestations: Secondary | ICD-10-CM | POA: Insufficient documentation

## 2023-04-13 DIAGNOSIS — Z7984 Long term (current) use of oral hypoglycemic drugs: Secondary | ICD-10-CM | POA: Diagnosis not present

## 2023-04-13 DIAGNOSIS — D72829 Elevated white blood cell count, unspecified: Secondary | ICD-10-CM | POA: Diagnosis not present

## 2023-04-13 DIAGNOSIS — R531 Weakness: Secondary | ICD-10-CM

## 2023-04-13 DIAGNOSIS — E871 Hypo-osmolality and hyponatremia: Secondary | ICD-10-CM | POA: Diagnosis not present

## 2023-04-13 DIAGNOSIS — E876 Hypokalemia: Secondary | ICD-10-CM | POA: Diagnosis not present

## 2023-04-13 DIAGNOSIS — I10 Essential (primary) hypertension: Secondary | ICD-10-CM | POA: Diagnosis not present

## 2023-04-13 DIAGNOSIS — E119 Type 2 diabetes mellitus without complications: Secondary | ICD-10-CM | POA: Diagnosis not present

## 2023-04-13 LAB — RESP PANEL BY RT-PCR (RSV, FLU A&B, COVID)  RVPGX2
Influenza A by PCR: POSITIVE — AB
Influenza B by PCR: NEGATIVE
Resp Syncytial Virus by PCR: NEGATIVE
SARS Coronavirus 2 by RT PCR: NEGATIVE

## 2023-04-13 LAB — CBC WITH DIFFERENTIAL/PLATELET
Abs Immature Granulocytes: 0.01 10*3/uL (ref 0.00–0.07)
Basophils Absolute: 0 10*3/uL (ref 0.0–0.1)
Basophils Relative: 0 %
Eosinophils Absolute: 0 10*3/uL (ref 0.0–0.5)
Eosinophils Relative: 0 %
HCT: 39.9 % (ref 39.0–52.0)
Hemoglobin: 13 g/dL (ref 13.0–17.0)
Immature Granulocytes: 0 %
Lymphocytes Relative: 8 %
Lymphs Abs: 0.4 10*3/uL — ABNORMAL LOW (ref 0.7–4.0)
MCH: 26.5 pg (ref 26.0–34.0)
MCHC: 32.6 g/dL (ref 30.0–36.0)
MCV: 81.3 fL (ref 80.0–100.0)
Monocytes Absolute: 0.6 10*3/uL (ref 0.1–1.0)
Monocytes Relative: 12 %
Neutro Abs: 3.8 10*3/uL (ref 1.7–7.7)
Neutrophils Relative %: 80 %
Platelets: 180 10*3/uL (ref 150–400)
RBC: 4.91 MIL/uL (ref 4.22–5.81)
RDW: 15 % (ref 11.5–15.5)
WBC: 4.8 10*3/uL (ref 4.0–10.5)
nRBC: 0 % (ref 0.0–0.2)

## 2023-04-13 LAB — COMPREHENSIVE METABOLIC PANEL
ALT: 18 U/L (ref 0–44)
AST: 24 U/L (ref 15–41)
Albumin: 3.6 g/dL (ref 3.5–5.0)
Alkaline Phosphatase: 41 U/L (ref 38–126)
Anion gap: 8 (ref 5–15)
BUN: 14 mg/dL (ref 8–23)
CO2: 23 mmol/L (ref 22–32)
Calcium: 8.5 mg/dL — ABNORMAL LOW (ref 8.9–10.3)
Chloride: 101 mmol/L (ref 98–111)
Creatinine, Ser: 1.23 mg/dL (ref 0.61–1.24)
GFR, Estimated: 59 mL/min — ABNORMAL LOW (ref >60)
Glucose, Bld: 153 mg/dL — ABNORMAL HIGH (ref 70–99)
Potassium: 3.3 mmol/L — ABNORMAL LOW (ref 3.5–5.1)
Sodium: 132 mmol/L — ABNORMAL LOW (ref 135–145)
Total Bilirubin: 0.6 mg/dL (ref 0.0–1.2)
Total Protein: 7.2 g/dL (ref 6.5–8.1)

## 2023-04-13 LAB — PROTIME-INR
INR: 1.1 (ref 0.8–1.2)
Prothrombin Time: 14.5 s (ref 11.4–15.2)

## 2023-04-13 LAB — I-STAT CG4 LACTIC ACID, ED: Lactic Acid, Venous: 1.4 mmol/L (ref 0.5–1.9)

## 2023-04-13 MED ORDER — OSELTAMIVIR PHOSPHATE 75 MG PO CAPS: 75.0000 mg | ORAL_CAPSULE | Freq: Two times a day (BID) | ORAL | 0 refills | Status: AC

## 2023-04-13 MED ORDER — SODIUM CHLORIDE 0.9 % IV BOLUS
1000.0000 mL | Freq: Once | INTRAVENOUS | Status: AC
  Administered 2023-04-13: 1000 mL via INTRAVENOUS

## 2023-04-13 MED ORDER — IBUPROFEN 200 MG PO TABS
400.0000 mg | ORAL_TABLET | Freq: Once | ORAL | Status: AC
  Administered 2023-04-13: 400 mg via ORAL
  Filled 2023-04-13: qty 2

## 2023-04-13 MED ORDER — POTASSIUM CHLORIDE CRYS ER 20 MEQ PO TBCR
40.0000 meq | EXTENDED_RELEASE_TABLET | Freq: Once | ORAL | Status: AC
  Administered 2023-04-13: 40 meq via ORAL
  Filled 2023-04-13: qty 2

## 2023-04-13 MED ORDER — BENZONATATE 100 MG PO CAPS: 100.0000 mg | ORAL_CAPSULE | Freq: Three times a day (TID) | ORAL | 0 refills | Status: DC

## 2023-04-13 NOTE — ED Provider Notes (Signed)
 [place] EMERGENCY DEPARTMENT AT [facility] Provider Note   CSN: [id number] Arrival date & time: 04/13/23  1821     History  Chief Complaint  Patient presents with   Weakness    Matthew Hooper is a 83 y.o. male.  Patient is an 83 year old male with a past medical history of hypertension and diabetes presenting to the emergency department with generalized weakness.  Patient states that he has been feeling weak and fatigued for the last 2 days.  He reports he has had no appetite today.  He reports he has had a productive cough and congestion.  He denied any known fevers at home but states that he did feel chilled and bodyaches.  He denies any vomiting or diarrhea, dysuria or hematuria.  He denies any known sick contacts.  The history is provided by the patient and the spouse.  Weakness      Home Medications Prior to Admission medications   Medication Sig Start Date End Date Taking? Authorizing Provider  benzonatate (TESSALON) 100 MG capsule Take 1 capsule (100 mg total) by mouth every 8 (eight) hours. 04/13/23  Yes Elayne Snare K, DO  oseltamivir (TAMIFLU) 75 MG capsule Take 1 capsule (75 mg total) by mouth every 12 (twelve) hours. 04/13/23  Yes Elayne Snare K, DO  aspirin 81 MG EC tablet Take 1 tablet by mouth daily. 07/27/18   [provider]  COSOPT 2-0.5 % ophthalmic solution 1 drop 2 (two) times daily. 09/19/22   [provider]  D3-50 1.25 MG (50000 UT) capsule Take 50,000 Units by mouth daily. 03/10/19   [provider]  dorzolamide (TRUSOPT) 2 % ophthalmic solution Place 1 drop into both eyes every evening. 10/02/18   [provider]  Lancets (ONETOUCH DELICA PLUS LANCET33G) MISC Apply 1 each topically daily. 10/08/19   [provider]  latanoprost (XALATAN) 0.005 % ophthalmic solution Place 1 drop into both eyes at bedtime. 09/23/19   [provider]  metFORMIN (GLUCOPHAGE) 1000 MG tablet Take 1,000 mg by mouth  2 (two) times daily with a meal.    [provider]  pravastatin (PRAVACHOL) 80 MG tablet Take 80 mg by mouth at bedtime.    [provider]  valsartan (DIOVAN) 160 MG tablet Take 160 mg by mouth daily.  10/14/18   [provider]      Allergies    Patient has no known allergies.    Review of Systems   Review of Systems  Neurological:  Positive for weakness.    Physical Exam Updated Vital Signs BP 138/81   Pulse 91   Temp 98.4 F (36.9 C) (Oral)   Resp 20   Ht 6\' 2"  (1.88 m)   Wt 95.3 kg   SpO2 96%   BMI 26.98 kg/m  Physical Exam Vitals and nursing note reviewed.  Constitutional:      General: He is not in acute distress.    Appearance: Normal appearance. He is ill-appearing.  HENT:     Head: Normocephalic and atraumatic.     Nose: Nose normal.     Mouth/Throat:     Mouth: Mucous membranes are dry.     Pharynx: Oropharynx is clear.  Eyes:     Extraocular Movements: Extraocular movements intact.     Conjunctiva/sclera: Conjunctivae normal.     Pupils: Pupils are equal, round, and reactive to light.  Cardiovascular:     Rate and Rhythm: Normal rate and regular rhythm.     Heart  sounds: Normal heart sounds.  Pulmonary:     Effort: Pulmonary effort is normal.     Breath sounds: Normal breath sounds.  Abdominal:     General: Abdomen is flat.     Palpations: Abdomen is soft.     Tenderness: There is no abdominal tenderness.  Musculoskeletal:        General: Normal range of motion.     Cervical back: Normal range of motion and neck supple.  Skin:    General: Skin is warm and dry.  Neurological:     General: No focal deficit present.     Mental Status: He is alert and oriented to person, place, and time.     Sensory: No sensory deficit.     Motor: No weakness.  Psychiatric:        Mood and Affect: Mood normal.        Behavior: Behavior normal.     ED Results / Procedures / Treatments   Labs (all labs ordered are listed, but only  abnormal results are displayed) Labs Reviewed  RESP PANEL BY RT-PCR (RSV, FLU A&B, COVID)  RVPGX2 - Abnormal; Notable for the following components:      Result Value   Influenza A by PCR POSITIVE (*)    All other components within normal limits  COMPREHENSIVE METABOLIC PANEL - Abnormal; Notable for the following components:   Sodium 132 (*)    Potassium 3.3 (*)    Glucose, Bld 153 (*)    Calcium 8.5 (*)    GFR, Estimated 59 (*)    All other components within normal limits  CBC WITH DIFFERENTIAL/PLATELET - Abnormal; Notable for the following components:   Lymphs Abs 0.4 (*)    All other components within normal limits  CULTURE, BLOOD (ROUTINE X 2)  CULTURE, BLOOD (ROUTINE X 2)  PROTIME-INR  URINALYSIS, W/ REFLEX TO CULTURE (INFECTION SUSPECTED)  I-STAT CG4 LACTIC ACID, ED    EKG None  Radiology DG Chest Port 1 View Result Date: 04/13/2023 CLINICAL DATA:  Sepsis EXAM: PORTABLE CHEST 1 VIEW COMPARISON:  Chest radiograph 04/16/2021 FINDINGS: Monitoring leads overlie the patient. Stable cardiac and mediastinal contours. No large area pulmonary consolidation. No pleural effusion or pneumothorax. Thoracic spine degenerative changes. IMPRESSION: No active disease. Electronically Signed   By: Annia Belt M.D.   On: 04/13/2023 21:55    Procedures Procedures    Medications Ordered in ED Medications  ibuprofen (ADVIL) tablet 400 mg (400 mg Oral Given 04/13/23 2039)  sodium chloride 0.9 % bolus 1,000 mL (0 mLs Intravenous Stopped 04/13/23 2218)  potassium chloride SA (KLOR-CON M) CR tablet 40 mEq (40 mEq Oral Given 04/13/23 2039)    ED Course/ Medical Decision Making/ A&P Clinical Course as of 04/13/23 2256  Mon Apr 13, 2023  2014 Patient influenza positive. With recent onset of symptoms, age and risk factors will be recommended tamiflu. CXR read pending. [VK]  2224 No acute disease on CXR. Pending ambulatory trial. [VK]  2235 Patient able to ambulate steadily on his own without any  dizziness. He is stable for discharge home on Tamiflu with close primary care follow up. [VK]    Clinical Course User Index [VK] Rexford Maus, DO                                 Medical Decision Making This patient presents to the ED with chief complaint(s) of weakness, fever with pertinent  past medical history of hypertension, diabetes which further complicates the presenting complaint. The complaint involves an extensive differential diagnosis and also carries with it a high risk of complications and morbidity.    The differential diagnosis includes ACS, arrhythmia, anemia, dehydration, electrolyte abnormality, viral syndrome, pneumonia, pulmonary edema, pleural effusion, sepsis  Additional history obtained: Additional history obtained from spouse and EMS  Records reviewed Care Everywhere/External Records  ED Course and Reassessment: On patient's arrival he was febrile and tachycardic otherwise hemodynamically stable in no acute distress.  Did receive Tylenol and route by EMS will be given additional Motrin here.  Patient is initially evaluated in triage and had labs performed.  Labs showed normal leukocytosis and lactic making sepsis unlikely.  Mild hyponatremia and hypokalemia which will be repleted.  Chest x-ray and viral swab pending at this time and he will be closely reassessed.  Independent labs interpretation:  The following labs were independently interpreted: flu A positive, mild hypokalemia  Independent visualization of imaging: - I independently visualized the following imaging with scope of interpretation limited to determining acute life threatening conditions related to emergency care: CXR, which revealed no acute disease  Consultation: - Consulted or discussed management/test interpretation w/ external professional: N/A  Consideration for admission or further workup: Patient has no emergent conditions requiring admission or further work-up at this time and is  stable for discharge home with primary care follow-up  Social Determinants of health: N/A    Amount and/or Complexity of Data Reviewed Labs: ordered.  Risk OTC drugs. Prescription drug management.          Final Clinical Impression(s) / ED Diagnoses Final diagnoses:  Influenza A  Weakness    Rx / DC Orders ED Discharge Orders          Ordered    oseltamivir (TAMIFLU) 75 MG capsule  Every 12 hours        04/13/23 2255    benzonatate (TESSALON) 100 MG capsule  Every 8 hours        04/13/23 2255              Tywan, Siever K, DO 04/13/23 2256

## 2023-04-13 NOTE — Discharge Instructions (Signed)
 You were seen in the emergency department for your weakness and your fever.  You tested positive for the flu.  I have given you prescription of Tamiflu and you can complete this as prescribed as well as Tessalon that you can take as needed for your cough.  You can take Tylenol and Motrin as needed for your fever.  You should make sure you are drinking plenty of fluids and staying well-hydrated and can follow-up with your primary doctor in the next few days to have your symptoms rechecked.  You should return to the emergency department for worsening shortness of breath, worsening weakness or dehydration or any other new or concerning symptoms.

## 2023-04-13 NOTE — ED Triage Notes (Signed)
 Patient brought in by EMS due to weakness, general illness and unable to walk. Per EMS report, patient started feeling bad this morning and it has progressed throughout the day. Reports patient unable to ambulate now due to severe weakness. EMS administered 650mg  tylenol in route. Pt still febrile upon arrival to the ER. Pt reports last ate or drank yesterday.  Denies nausea, vomiting or diarrhea.

## 2023-04-16 ENCOUNTER — Ambulatory Visit: Payer: Medicare HMO | Admitting: Primary Care

## 2023-04-18 LAB — CULTURE, BLOOD (ROUTINE X 2)
Culture: NO GROWTH
Culture: NO GROWTH
Special Requests: ADEQUATE
Special Requests: ADEQUATE

## 2023-04-29 ENCOUNTER — Other Ambulatory Visit: Payer: Self-pay | Admitting: *Deleted

## 2023-04-29 ENCOUNTER — Ambulatory Visit (HOSPITAL_COMMUNITY)
Admission: RE | Admit: 2023-04-29 | Discharge: 2023-04-29 | Disposition: A | Discharge status: Home / Self Care | Source: Ambulatory Visit | Attending: Vascular Surgery | Admitting: Vascular Surgery

## 2023-04-29 DIAGNOSIS — R0989 Other specified symptoms and signs involving the circulatory and respiratory systems: Secondary | ICD-10-CM | POA: Diagnosis not present

## 2023-04-30 ENCOUNTER — Encounter: Payer: Self-pay | Admitting: Podiatry

## 2023-04-30 ENCOUNTER — Ambulatory Visit (INDEPENDENT_AMBULATORY_CARE_PROVIDER_SITE_OTHER): Payer: Medicare HMO | Admitting: Podiatry

## 2023-04-30 DIAGNOSIS — B351 Tinea unguium: Secondary | ICD-10-CM

## 2023-04-30 DIAGNOSIS — M79674 Pain in right toe(s): Secondary | ICD-10-CM

## 2023-04-30 DIAGNOSIS — M79675 Pain in left toe(s): Secondary | ICD-10-CM

## 2023-04-30 DIAGNOSIS — E119 Type 2 diabetes mellitus without complications: Secondary | ICD-10-CM

## 2023-04-30 LAB — VAS US ABI WITH/WO TBI
Left ABI: 1.14
Right ABI: 1.24

## 2023-05-03 NOTE — Progress Notes (Signed)
 Subjective: Chief Complaint  Patient presents with   [facility]    RM#[id number] [facility]    83 y.o. returns the office today for painful, elongated, thickened toenails which he cannot trim himself. Denies any ulcerations or open lesions.  Denies any systemic complaints such as fevers, chills, nausea, vomiting.   PCP: DR. Patrecia Pour, PA-C Last seen: 04/30/2023  Last A1c 6.7 on 12/12/2021  Objective: AAO 3, NAD DP/PT pulses palpable, CRT less than 3 seconds Nails hypertrophic, dystrophic, elongated, brittle, discolored 10. There is tenderness overlying the nails 1-5 bilaterally. There is no surrounding erythema or drainage along the nail sites. No open lesion identified bilaterally. No pain with calf compression, swelling, warmth, erythema.  Assessment: Symptomatic onychomycosis  Plan: -Treatment options including alternatives, risks, complications were discussed -Nails sharply debrided 10 without complication/bleeding. -Recommend moisturizer daily and offloading. -Continue with daily foot inspection. -Follow-up as scheduled or sooner if any problems are to arise. In the meantime, encouraged to call the office with any questions, concerns, changes symptoms.  Return in about 3 months (around 07/31/2023).  Ovid Curd, DPM

## 2023-05-08 NOTE — Progress Notes (Signed)
 Necessary for patient work up.

## 2023-05-13 ENCOUNTER — Ambulatory Visit (INDEPENDENT_AMBULATORY_CARE_PROVIDER_SITE_OTHER): Admitting: Vascular Surgery

## 2023-05-13 ENCOUNTER — Encounter: Payer: Self-pay | Admitting: Vascular Surgery

## 2023-05-13 VITALS — BP 111/61 | HR 69 | Temp 97.6°F | Ht 74.0 in | Wt 199.0 lb

## 2023-05-13 DIAGNOSIS — I739 Peripheral vascular disease, unspecified: Secondary | ICD-10-CM | POA: Diagnosis not present

## 2023-05-13 NOTE — Progress Notes (Signed)
 Patient ID: Matthew Hooper, male   DOB: 1940/05/07, 83 y.o.   MRN: [id number]  Reason for Consult: New Patient (Initial Visit)   Referred by Julien Girt, PA*  Subjective:     HPI:  Matthew Hooper is a 83 y.o. male with history of diabetes, hyperlipidemia, hypertension and is a current everyday smoker.  He denies any previous history of vascular disease.  He was recently found to have decreased pulses with some discoloration of his toes and is now sent for further evaluation.  Patient walks multiple hours every day looking for lottery digits as he previously found to 30,000 on the winter on the ground.  He does not have any claudication.  He denies tissue loss or ulceration.  No personal or family history of aneurysm disease.  He is prescribed aspirin but does not take regular.  Past Medical History:  Diagnosis Date   Diabetes mellitus without complication (HCC)    Glaucoma 04/17/2021   Hyperlipidemia    Hypertension    Family History  Problem Relation Age of Onset   Diabetes Mellitus II Mother    Hypertension Mother    Pancreatic cancer Mother    Colon cancer Father    Diabetes Mellitus II Sister    Diabetes Mellitus II Brother    Past Surgical History:  Procedure Laterality Date   APPENDECTOMY      Short Social History:  Social History   Tobacco Use   Smoking status: Every Day    Current packs/day: 0.75    Average packs/day: 0.8 packs/day for 62.0 years (46.5 ttl pk-yrs)    Types: Cigarettes   Smokeless tobacco: Never   Tobacco comments:    1/2 per day   Substance Use Topics   Alcohol use: Not on file    No Known Allergies  Current Outpatient Medications  Medication Sig Dispense Refill   aspirin 81 MG EC tablet Take 1 tablet by mouth daily.     benzonatate (TESSALON) 100 MG capsule Take 1 capsule (100 mg total) by mouth every 8 (eight) hours. 21 capsule 0   COSOPT 2-0.5 % ophthalmic solution 1 drop 2 (two) times daily.     D3-50 1.25 MG (50000 UT) capsule Take  50,000 Units by mouth daily.     dorzolamide (TRUSOPT) 2 % ophthalmic solution Place 1 drop into both eyes every evening.     Lancets (ONETOUCH DELICA PLUS LANCET33G) MISC Apply 1 each topically daily.     latanoprost (XALATAN) 0.005 % ophthalmic solution Place 1 drop into both eyes at bedtime.     metFORMIN (GLUCOPHAGE) 1000 MG tablet Take 1,000 mg by mouth 2 (two) times daily with a meal.     oseltamivir (TAMIFLU) 75 MG capsule Take 1 capsule (75 mg total) by mouth every 12 (twelve) hours. 10 capsule 0   pravastatin (PRAVACHOL) 80 MG tablet Take 80 mg by mouth at bedtime.     valsartan (DIOVAN) 160 MG tablet Take 160 mg by mouth daily.      No current facility-administered medications for this visit.    Review of Systems  Constitutional:  Constitutional negative. HENT: HENT negative.  Eyes: Eyes negative.  Respiratory: Respiratory negative.  Cardiovascular: Cardiovascular negative.  GI: Gastrointestinal negative.  Musculoskeletal: Musculoskeletal negative.  Skin: Skin negative.  Neurological: Neurological negative. Hematologic: Hematologic/lymphatic negative.  Psychiatric: Psychiatric negative.        Objective:  Objective   Vitals:   05/13/23 0854  BP: 111/61  Pulse: 69  Temp: 97.6 F (36.4  C)  SpO2: 97%  Weight: 199 lb (90.3 kg)  Height: 6\' 2"  (1.88 m)   Body mass index is 25.55 kg/m.  Physical Exam HENT:     Head: Normocephalic.     Nose: Nose normal.  Eyes:     Pupils: Pupils are equal, round, and reactive to light.  Neck:     Vascular: No carotid bruit.  Cardiovascular:     Rate and Rhythm: Normal rate.     Pulses: Normal pulses.  Pulmonary:     Effort: Pulmonary effort is normal.  Abdominal:     General: Abdomen is flat.  Musculoskeletal:        General: Normal range of motion.     Cervical back: Normal range of motion and neck supple.  Skin:    General: Skin is warm.     Capillary Refill: Capillary refill takes less than 2 seconds.   Neurological:     General: No focal deficit present.     Mental Status: He is alert.     Data: ABI Findings:  +---------+------------------+-----+-----------+  Right   Rt Pressure (mmHg)IndexWaveform     +---------+------------------+-----+-----------+  Brachial 135                                 +---------+------------------+-----+-----------+  PTA     168               1.24 multiphasic  +---------+------------------+-----+-----------+  DP      154               1.14 multiphasic  +---------+------------------+-----+-----------+  Great Toe65                0.48 Abnormal     +---------+------------------+-----+-----------+   +---------+------------------+-----+-----------+  Left    Lt Pressure (mmHg)IndexWaveform     +---------+------------------+-----+-----------+  Brachial 132                                 +---------+------------------+-----+-----------+  PTA     154               1.14 multiphasic  +---------+------------------+-----+-----------+  DP      153               1.13 multiphasic  +---------+------------------+-----+-----------+  Great Toe96                0.71 Normal       +---------+------------------+-----+-----------+   +-------+-----------+-----------+  ABI/TBIToday's ABIToday's TBI  +-------+-----------+-----------+  Right 1.24       0.48         +-------+-----------+-----------+  Left  1.14       0.71         +-------+-----------+-----------+        Summary:  Right: Resting right ankle-brachial index is within normal range. The  right toe-brachial index is abnormal.   Left: Resting left ankle-brachial index is within normal range. The left  toe-brachial index is normal.      Assessment/Plan:    83 year old male was sent here for evaluation of decreased peripheral pulses with ABIs that are normal and distal palpable pulses.  Patient walks multiple hours a day does have some  complaints of mild claudication after very long distance walking this does not seem to necessarily be related to arterial insufficiency.  I have recommended smoking cessation and continuing aspirin therapy as  he is prescribed.  He can follow-up with me on an as-needed basis.    Maeola Harman MD [facility]

## 2023-06-01 ENCOUNTER — Ambulatory Visit (INDEPENDENT_AMBULATORY_CARE_PROVIDER_SITE_OTHER): Admitting: Primary Care

## 2023-06-01 ENCOUNTER — Encounter: Payer: Self-pay | Admitting: Primary Care

## 2023-06-01 VITALS — BP 112/68 | HR 69 | Temp 97.5°F | Ht 73.0 in | Wt 200.6 lb

## 2023-06-01 DIAGNOSIS — G4733 Obstructive sleep apnea (adult) (pediatric): Secondary | ICD-10-CM | POA: Diagnosis not present

## 2023-06-01 DIAGNOSIS — J432 Centrilobular emphysema: Secondary | ICD-10-CM | POA: Diagnosis not present

## 2023-06-01 MED ORDER — TIOTROPIUM BROMIDE-OLODATEROL 2.5-2.5 MCG/ACT IN AERS: 2.0000 | INHALATION_SPRAY | Freq: Every day | RESPIRATORY_TRACT | 5 refills | Status: DC

## 2023-06-01 MED ORDER — BENZONATATE 100 MG PO CAPS: 100.0000 mg | ORAL_CAPSULE | Freq: Three times a day (TID) | ORAL | 1 refills | Status: AC | PRN

## 2023-06-01 NOTE — Patient Instructions (Signed)
 -  OBSTRUCTIVE SLEEP APNEA: Obstructive sleep apnea is a condition where your airway becomes blocked during sleep, causing breathing pauses. We encourage you to use your CPAP machine every night to help keep your airway open and reduce symptoms. We also prescribed Tessalon  Perles to help manage your cough and discussed the benefits of CPAP in reducing airway swelling and improving sleep quality.  -CHRONIC COUGH: A chronic cough can be caused by various factors, including bronchitis and smoking. We refilled your prescription for Tessalon  Perles and prescribed a Stiolto inhaler to help manage symptoms of suspected COPD and chronic bronchitis. Please use the Stiolto inhaler as directed, two puffs daily in the morning. We also ordered a breathing test to assess for COPD.  -SUSPECTED COPD: Chronic obstructive pulmonary disease (COPD) is a lung condition that makes it hard to breathe and is often caused by smoking. Given your history and symptoms, we suspect you may have COPD. We prescribed a Stiolto inhaler for daily use to help manage your symptoms and ordered a breathing test to confirm the diagnosis.  -SMOKING CESSATION: Smoking cessation is the process of quitting smoking. Given your long history of smoking, we discussed various options to help you quit, including nicotine  patches and gum. We encourage you to set a quit date and plan your strategy. We advised against using Chantix due to potential side effects.  INSTRUCTIONS: Please use your CPAP machine every night and take Tessalon  Perles three times daily as needed for your cough. Use the Stiolto inhaler as directed, two puffs daily in the morning. We have ordered a breathing test to assess for COPD, so please schedule this test at your earliest convenience. Consider setting a quit date for smoking and explore nicotine  patches or gum to help you quit. Follow up with us  as needed to discuss your progress and any concerns.  Orders: PFTs first  available  Follow-up: 8 weeks with Beth NP or sooner if needed

## 2023-06-01 NOTE — Progress Notes (Signed)
 @Patient  ID: Matthew Hooper, male    DOB: 12/01/1940, 83 y.o.   MRN: [id number]  Chief Complaint  Patient presents with   Follow-up    CPAP F/U    Referring provider: Emaline Handsome, MD  HPI:  83 year old MALE, current smoker. PMH significant for OSA, tobacco abuse. Patient of Dr. Villa Greaser. HST 08/2017 showed moderate OSA with 18/h. Maintained on auto CPAP 5-15cm h20.   Previous [facility] pulmonary encounter: 09/07/2018 Patient presents today for OSA 6 month follow-up. He is doing well, no complaints. Moderate compliance with CPAP. States that he forgets to put mask on a few times because he falls asleep on the couch. He reports sleeping well at night with less snoring. Feels well rested when he wakes up in the morning.   Airview download:  14/30 days (47%); 14 days >4 hours.  Pressure 5-15cm H20 (12.2) AHI 2.2   03/15/2019 Patient presents today for 6 month OSA follow-up. He is doing well, no acute complaints. He is mostly compliant with CPAP. States that he has had early doctors appointment and had recent family member pass away. No issues with machine, mask fit or pressure setting. Sleeping well through the night with less snoring. Takes naps during the day but does not use CPAP then. No trouble breathing. He is still smoking 0.75 ppd, he would like to quit and states that the [place] gave him nicotine  patches. His wife is his support system. Denies shortness of breath, chest tightness or wheezing.   Airview download 02/11/19-03/12/19: Usage 12/30 days; 12 days > 4 hours Average usage days used- 6 hours 14 mins Pressure 5-15cm h20 (14.5- 95%) Leaks 28.9L/min AHI 3.2   11/10/2019 Patient presents today for 75-month follow-up CT imaging and OSA. Patient is current smoker, he is apart of our lung cancer screening program. He had annual low-dose CT chest on 11/07/2019 that showed lung RADS 2, right upper lobe pulmonary nodule 2.6 mm which is not significantly changed. Discussed smoking cessation with him  today.   In regards to his sleep apnea he remains moderately compliant with CPAP. No issues with pressure setting or mask fit. He is sleeping well, gets approx 7 hours at night. He tends to not wear his CPAP on days when he needs to bring his wife to doctors apt the following day. States that he sleeps so well with CPAP he is worried about over sleeping or not waking up at night if she needs him. He drives his wife on Wednesday to clinic in [place] for wound check/dressing change lower on her lower extremity.   Airview download 10/10/19-11/08/19: Usage days 17/30 (57%) Average usage 5 hours 46 mins  Pressure 5-15cm h20 (13.4cm h20-95%) Leaks 21.5L/min (95%) AHI 1.9   10/17/2022 Patient presents today for overdue follow-up OSA. CPAP compliance in the past had been inconsistent at times. He reports sleeping so well when he wears his CPAP that he is worried about over sleeping or not waking up at night if his wife needs him. He gets approximately 7 hours of sleep a night. Snoring symptoms are keeping his wife awake at night. He has not worn his CPAP in the last 3-4 months. His machine is > 37 years old and stopped working. No issues with pressure setting.  Bedtime is 1:30 AM. It takes him 5-64mins to fall asleep. Starts his day at 9am. Weight is down 15 lbs. Epworth score 5/24.   He has a chronic cough. No shortness of breath. Able to do  all ADLs. Current smoker. He was following with lung cancer screening program up until 2022, no longer qualifies for screening program due to age. LDCT 12/28/20 >> showed lung RADS 2, benign appearance or behavior. Emphysema.   OSA (obstructive sleep apnea) - HST 08/2017 showed moderate OSA with 18/h. Maintained on auto CPAP 5-15cm h20. CPAP stopped working 3-4 months ago. Needs order for new machine. No changes recommended to pressure settings. DME company is [employer]. FU in 3 months for compliance check.    Centrilobular emphysema (HCC) - Chronic cough, no dyspnea   - Centrilobular and paraseptal emphysema on CT imaging - Trial Spiriva Respimat 2.5mcg two puff daily x 2 weeks, if beneficial advised patient notify offivce and we will send in RX    01/16/2023 - Interim hx  Discussed the use of AI scribe software for clinical note transcription with the patient, who gave verbal consent to proceed.  History of Present Illness   The patient, with a history of sleep apnea, emphysema, and tobacco abuse, presents for a follow-up visit after receiving a new CPAP machine approximately a month ago. He reports consistent nightly use of the new machine without any issues and notes an improvement in sleep quality. Despite this, the patient continues to experience some apneic events, albeit fewer than during his initial sleep study.  The patient denies any changes to his medication regimen, which includes aspirin , metformin , pravastatin , valsartan, and eye drops. He takes no sleep aids and does not drink alcohol. He denies any nocturnal symptoms such as shortness of breath and reports stable weight and absence of leg swelling.  Regarding his sleep pattern, the patient reports variable bedtime, usually around midnight or later, and wakes up between seven and nine in the morning, feeling rested. He occasionally takes daytime naps but does not use the CPAP machine during these times. He denies any instances of falling asleep while driving.  In terms of his emphysema, the patient reports an occasional cough but denies it being bothersome or interfering with his daily life. He never tried sample of Stiolto. He denies any significant respiratory symptoms. He continues to smoke, consuming approximately half a pack per day, but expresses readiness to consider quitting. He has tried patches in the past. He has been part of a lung cancer screening program and his CT scans have shown no suspicious pulmonary nodules.     Airview download 01/05/23-01/14/23 Usage 10/10 days (100%) Average  usage days used 5 hours 35 mins  Pressure 5-15cm h20 (10.8cm h20-95%) Airleaks 9.5L/min (95%) Apnea index- central 5.2/obstructive 2.5 AHI 8.5    06/01/2023- Interim hx  Discussed the use of AI scribe software for clinical note transcription with the patient, who gave verbal consent to proceed.  History of Present Illness   Matthew Hooper is an 83 year old male with sleep apnea who presents for follow-up on CPAP compliance and management of chronic cough.  He has a history of moderate obstructive sleep apnea diagnosed in 2019, with a sleep study showing 18 apneic events per hour. He was started on auto CPAP therapy. Over the last 90 days, his CPAP compliance has been about 50%, largely due to hospitalizations and a subsequent episode of bronchitis in March, during which he did not use the CPAP for approximately three weeks. He does not use the CPAP on nights before appointments to avoid oversleeping. He has been more consistent with CPAP use in April.  He experiences a chronic cough, which he attributes to difficulty using the CPAP. He  has occasional coughing spells and sometimes needs to expectorate. He has a history of smoking for 70 years and is a current everyday smoker. He recalls using Tessalon  Perles in the past, which helped alleviate the cough slightly. He has a history of emphysema noted on a CT scan and has been given an inhaler Stage manager) previously, which he has not used due to lack of symptoms at the time. He has not had a formal breathing test to assess for COPD, although there has been suspicion of the condition. He has a history of emphysema.  He reports snoring, particularly when not using the CPAP. His wife confirms that he snores more when the CPAP is not in use. He shares a room with her, and she notes that his snoring affects her sleep.  He has a long history of smoking but does not smoke in the house or car. His wife quit smoking 30 years ago. He wants to quit smoking and is  considering options such as nicotine  patches or gum.      Airview download 02/27/23-05/27/23 Usage days 44/90 (49%); 44 days (49%) >4 hours Average usage days used 5 hours 52 mins Pressure 9cm h20 Airleaks 19L/min (95%) AHI 3.5   Imaging: LDCT 10/29/18 >> Lung- RADS 2 (08/2017 - RADS 1S).  LDCT 11/07/19 >> Lung - RADS 2, benign appearance or behavior  LDCT 12/28/20 >> Lung- RADS2, benign appearance or behavior. Centrilobular and paraseptal emphysema. Calcified left lower lobe granuloma. 2.4 mm posterior right lower lobe nodule, unchanged. No new or suspicious pulmonary nodules.    No Known Allergies  Immunization History  Administered Date(s) Administered   Fluad Quad(high Dose 65+) 11/11/2018   Influenza, High Dose Seasonal PF 11/02/2015, 01/10/2017   Influenza,inj,Quad PF,6+ Mos 11/10/2017   Influenza-Unspecified 11/10/2017   PFIZER(Purple Top)SARS-COV-2 Vaccination 03/26/2019, 04/18/2019, 12/12/2019, 06/25/2020   Pfizer Covid-19 Vaccine Bivalent Booster 88yrs & up 12/04/2020, 09/02/2021   Pfizer(Comirnaty)Fall Seasonal Vaccine 12 years and older 01/31/2022   Pneumococcal Conjugate-13 10/10/2016   Pneumococcal Polysaccharide-23 01/10/2015    Past Medical History:  Diagnosis Date   Diabetes mellitus without complication (HCC)    Glaucoma 04/17/2021   Hyperlipidemia    Hypertension     Tobacco History: Social History   Tobacco Use  Smoking Status Every Day   Current packs/day: 0.75   Average packs/day: 0.8 packs/day for 62.0 years (46.5 ttl pk-yrs)   Types: Cigarettes  Smokeless Tobacco Never  Tobacco Comments   Less than 1/2 per day - AB, CMA 06-01-23   Ready to quit: Not Answered Counseling given: Not Answered Tobacco comments: Less than 1/2 per day - AB, CMA 06-01-23   Outpatient Medications Prior to Visit  Medication Sig Dispense Refill   aspirin  81 MG EC tablet Take 1 tablet by mouth daily.     COSOPT 2-0.5 % ophthalmic solution 1 drop 2 (two) times daily.      D3-50 1.25 MG (50000 UT) capsule Take 50,000 Units by mouth daily.     dorzolamide  (TRUSOPT ) 2 % ophthalmic solution Place 1 drop into both eyes every evening.     Lancets (ONETOUCH DELICA PLUS LANCET33G) MISC Apply 1 each topically daily.     latanoprost  (XALATAN ) 0.005 % ophthalmic solution Place 1 drop into both eyes at bedtime.     metFORMIN  (GLUCOPHAGE ) 1000 MG tablet Take 1,000 mg by mouth 2 (two) times daily with a meal.     oseltamivir  (TAMIFLU ) 75 MG capsule Take 1 capsule (75 mg total) by mouth every 12 (  twelve) hours. 10 capsule 0   pravastatin  (PRAVACHOL ) 80 MG tablet Take 80 mg by mouth at bedtime.     valsartan (DIOVAN) 160 MG tablet Take 160 mg by mouth daily.      benzonatate  (TESSALON ) 100 MG capsule Take 1 capsule (100 mg total) by mouth every 8 (eight) hours. 21 capsule 0   No facility-administered medications prior to visit.   Review of Systems  Review of Systems  Constitutional: Negative.   HENT:  Positive for congestion.   Respiratory:  Positive for cough.   Cardiovascular: Negative.    Physical Exam  BP 112/68 (BP Location: Right Arm, Patient Position: Sitting, Cuff Size: Large)   Pulse 69   Temp (!) 97.5 F (36.4 C) (Temporal)   Ht 6\' 1"  (1.854 m)   Wt 200 lb 9.6 oz (91 kg)   SpO2 97%   BMI 26.47 kg/m  Physical Exam Constitutional:      General: He is not in acute distress.    Appearance: Normal appearance. He is not ill-appearing.  HENT:     Head: Normocephalic and atraumatic.  Cardiovascular:     Rate and Rhythm: Normal rate and regular rhythm.  Pulmonary:     Effort: Pulmonary effort is normal.     Breath sounds: Normal breath sounds. No wheezing, rhonchi or rales.  Musculoskeletal:     Cervical back: Normal range of motion and neck supple.  Skin:    General: Skin is warm and dry.  Neurological:     General: No focal deficit present.     Mental Status: He is alert and oriented to person, place, and time. Mental status is at baseline.   Psychiatric:        Mood and Affect: Mood normal.        Behavior: Behavior normal.        Thought Content: Thought content normal.        Judgment: Judgment normal.      Lab Results:  CBC    Component Value Date/Time   WBC 4.8 04/13/2023 1852   RBC 4.91 04/13/2023 1852   HGB 13.0 04/13/2023 1852   HCT 39.9 04/13/2023 1852   PLT 180 04/13/2023 1852   MCV 81.3 04/13/2023 1852   MCH 26.5 04/13/2023 1852   MCHC 32.6 04/13/2023 1852   RDW 15.0 04/13/2023 1852   LYMPHSABS 0.4 (L) 04/13/2023 1852   MONOABS 0.6 04/13/2023 1852   EOSABS 0.0 04/13/2023 1852   BASOSABS 0.0 04/13/2023 1852    BMET    Component Value Date/Time   NA 132 (L) 04/13/2023 1852   K 3.3 (L) 04/13/2023 1852   CL 101 04/13/2023 1852   CO2 23 04/13/2023 1852   GLUCOSE 153 (H) 04/13/2023 1852   BUN 14 04/13/2023 1852   CREATININE 1.23 04/13/2023 1852   CALCIUM 8.5 (L) 04/13/2023 1852   GFRNONAA 59 (L) 04/13/2023 1852    BNP No results found for: "BNP"  ProBNP No results found for: "PROBNP"  Imaging: No results found.   Assessment & Plan:   1. Centrilobular emphysema (HCC) (Primary) - Pulmonary Function Test; Future  2. OSA (obstructive sleep apnea)  Assessment and Plan    Obstructive Sleep Apnea Moderate obstructive sleep apnea. Patient is 50% compliant with CPAP use over the last 90 days. Current pressure 9cm h20; Residual AHI 3.5/hour. Compliance affected by recent hospitalization and chronic bronchitis.  - Encourage consistent CPAP use nightly 4-6 hours or longer. - Prescribe Tessalon  Perles for cough, three times  daily as needed. - Educate on CPAP benefits in reducing airway swelling and improving sleep apnea.  Chronic Cough Chronic cough possibly related to bronchitis and smoking. Not formal PFTs on file. CPAP use not harmful during coughing spells and may reduce airway swelling. - Refill Tessalon  Perles. - Prescribe Stiolto inhaler for suspected COPD and chronic bronchitis. -  Instruct on Stiolto inhaler use, two puffs daily in the morning. - Order breathing test for obstructive lung disease due to smoking hx  Suspected COPD Suspected COPD due to smoking and emphysema on CT scan. Chronic bronchitis symptoms present. No previous breathing test conducted. - Order breathing test to confirm COPD diagnosis. - Prescribe Stiolto inhaler for daily symptom management.  Smoking Cessation Current everyday smoker with a 70-year history. Previous cessation attempts unsuccessful. Advised against Chantix due to age and potential cardiac side effects. - Discuss smoking cessation options including nicotine  patch and gum. - Encourage setting a quit date and planning cessation strategy.   Antonio Baumgarten, NP 06/01/2023

## 2023-06-02 ENCOUNTER — Telehealth: Payer: Self-pay

## 2023-06-02 NOTE — Telephone Encounter (Signed)
*  Pulm  Pharmacy Patient Advocate Encounter   Received notification from CoverMyMeds that prior authorization for Stiolto Respimat  2.5-2.5MCG/ACT aerosol  is required/requested.   Insurance verification completed.   The patient is insured through [facility] .   Per test claim:  Anoro Ellipta  or Irineo Manns is preferred by the insurance.  If suggested medication is appropriate, Please send in a new RX and discontinue this one. If not, please advise as to why it's not appropriate so that we may request a Prior Authorization. Please note, some preferred medications may still require a PA.  If the suggested medications have not been trialed and there are no contraindications to their use, the PA will not be submitted, as it will not be approved.   CMM Key: [id number]

## 2023-06-03 NOTE — Telephone Encounter (Signed)
 Please advise Beth of suggested alternatives

## 2023-06-04 MED ORDER — ANORO ELLIPTA 62.5-25 MCG/ACT IN AEPB: 1.0000 | INHALATION_SPRAY | Freq: Every day | RESPIRATORY_TRACT | 3 refills | Status: AC

## 2023-06-04 NOTE — Telephone Encounter (Signed)
 Stiolto not covered on patients insurance, I will change to Anoro which is formulary

## 2023-06-04 NOTE — Telephone Encounter (Signed)
 I called and spoke with the pt's spouse, ok per DPR and notified of response from [facility]. She verbalized understanding and will inform the pt. Nothing further needed.

## 2023-08-06 ENCOUNTER — Ambulatory Visit (INDEPENDENT_AMBULATORY_CARE_PROVIDER_SITE_OTHER): Admitting: Podiatry

## 2023-08-06 DIAGNOSIS — B351 Tinea unguium: Secondary | ICD-10-CM | POA: Diagnosis not present

## 2023-08-06 DIAGNOSIS — E119 Type 2 diabetes mellitus without complications: Secondary | ICD-10-CM

## 2023-08-06 DIAGNOSIS — M79674 Pain in right toe(s): Secondary | ICD-10-CM

## 2023-08-06 DIAGNOSIS — M79675 Pain in left toe(s): Secondary | ICD-10-CM | POA: Diagnosis not present

## 2023-08-09 NOTE — Progress Notes (Signed)
 Subjective: Chief Complaint  Patient presents with   [facility]    RM#14 DFC nail trimming.     83 y.o. returns the office today for painful, elongated, thickened toenails which he cannot trim himself. Denies any ulcerations or open lesions.  Denies any systemic complaints such as fevers, chills, nausea, vomiting.   PCP: DR. Josette Brinks, PA-C Last seen: 07/31/2023  Last A1c 6.6 on 07/31/2023  Objective: AAO 3, NAD DP/PT pulses palpable, CRT less than 3 seconds Nails hypertrophic, dystrophic, elongated, brittle, discolored 10. There is tenderness overlying the nails 1-5 bilaterally. There is no surrounding erythema or drainage along the nail sites. No open lesion identified bilaterally. No pain with calf compression, swelling, warmth, erythema.  Assessment: Symptomatic onychomycosis  Plan: -Treatment options including alternatives, risks, complications were discussed -Nails sharply debrided 10 without complication/bleeding. -Recommend moisturizer daily and offloading. -Continue with daily foot inspection. -Follow-up as scheduled or sooner if any problems are to arise. In the meantime, encouraged to call the office with any questions, concerns, changes symptoms.  Return in about 3 months (around 11/06/2023).  Matthew Hooper, DPM

## 2023-08-12 ENCOUNTER — Encounter: Payer: Self-pay | Admitting: Primary Care

## 2023-08-12 ENCOUNTER — Ambulatory Visit (INDEPENDENT_AMBULATORY_CARE_PROVIDER_SITE_OTHER): Admitting: Primary Care

## 2023-08-12 ENCOUNTER — Ambulatory Visit: Admitting: Pulmonary Disease

## 2023-08-12 VITALS — BP 126/64 | HR 70 | Temp 97.4°F | Ht 73.0 in | Wt 201.0 lb

## 2023-08-12 DIAGNOSIS — J432 Centrilobular emphysema: Secondary | ICD-10-CM | POA: Diagnosis not present

## 2023-08-12 DIAGNOSIS — F17210 Nicotine dependence, cigarettes, uncomplicated: Secondary | ICD-10-CM

## 2023-08-12 DIAGNOSIS — G4733 Obstructive sleep apnea (adult) (pediatric): Secondary | ICD-10-CM

## 2023-08-12 LAB — PULMONARY FUNCTION TEST
DL/VA % pred: 90 %
DL/VA: 3.45 ml/min/mmHg/L
DLCO unc % pred: 67 %
DLCO unc: 17.75 ml/min/mmHg
FEF 25-75 Post: 1.67 L/s
FEF 25-75 Pre: 0.84 L/s
FEF2575-%Change-Post: 97 %
FEF2575-%Pred-Post: 77 %
FEF2575-%Pred-Pre: 39 %
FEV1-%Change-Post: 17 %
FEV1-%Pred-Post: 63 %
FEV1-%Pred-Pre: 54 %
FEV1-Post: 2.02 L
FEV1-Pre: 1.72 L
FEV1FVC-%Change-Post: 11 %
FEV1FVC-%Pred-Pre: 91 %
FEV6-%Change-Post: 5 %
FEV6-%Pred-Post: 67 %
FEV6-%Pred-Pre: 63 %
FEV6-Post: 2.8 L
FEV6-Pre: 2.66 L
FEV6FVC-%Pred-Post: 106 %
FEV6FVC-%Pred-Pre: 106 %
FVC-%Change-Post: 5 %
FVC-%Pred-Post: 62 %
FVC-%Pred-Pre: 59 %
FVC-Post: 2.8 L
FVC-Pre: 2.66 L
Post FEV1/FVC ratio: 72 %
Post FEV6/FVC ratio: 100 %
Pre FEV1/FVC ratio: 65 %
Pre FEV6/FVC Ratio: 100 %
RV % pred: 149 %
RV: 4.29 L
TLC % pred: 90 %
TLC: 6.94 L

## 2023-08-12 NOTE — Patient Instructions (Signed)
 Full pft performed today.

## 2023-08-12 NOTE — Progress Notes (Signed)
 Full pft performed today.

## 2023-08-12 NOTE — Patient Instructions (Signed)
 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 Since you are currently not experiencing symptoms, we will hold off on scheduled bronchodilators. However, it is important to stop smoking as it increases the risk of worsening your condition. Please let us  know if you experience any return of symptoms like cough or shortness of breath, as we may then recommend using an inhaler.  -MODERATE SLEEP APNEA: Sleep apnea is a condition where your breathing stops and starts repeatedly during sleep. You were diagnosed with moderate sleep apnea in 2019 and have been using a CPAP machine, though your compliance has been around 62% over the last three months. To improve your condition, it is important to use the CPAP machine consistently every night for at least four to six hours. Establishing a regular bedtime routine can also help improve your compliance.  INSTRUCTIONS: Please make sure to use your CPAP machine every night for at least four to six hours and try to establish a regular bedtime routine. If you experience any return of symptoms like cough or shortness of breath, please report them to us . Additionally, we strongly encourage you to quit smoking to reduce the risk of exacerbating your lung condition.  Follow-up 6 months with [facility] NP

## 2023-08-12 NOTE — Progress Notes (Signed)
 @Patient  ID: Matthew Hooper, male    DOB: 22-Sep-1940, 83 y.o.   MRN: [id number]  Chief Complaint  Patient presents with   Follow-up    CPAP and PFT results.     Referring provider: Hope Almarie ORN, NP  HPI: 83 year old MALE, current smoker. PMH significant for OSA, tobacco abuse. Patient of Dr. Jude. HST 08/2017 showed moderate OSA with 18/h. Maintained on auto CPAP 5-15cm h20.   Previous [facility] pulmonary encounter: 09/07/2018 Patient presents today for OSA 6 month follow-up. He is doing well, no complaints. Moderate compliance with CPAP. States that he forgets to put mask on a few times because he falls asleep on the couch. He reports sleeping well at night with less snoring. Feels well rested when he wakes up in the morning.   Airview download:  14/30 days (47%); 14 days >4 hours.  Pressure 5-15cm H20 (12.2) AHI 2.2   03/15/2019 Patient presents today for 6 month OSA follow-up. He is doing well, no acute complaints. He is mostly compliant with CPAP. States that he has had early doctors appointment and had recent family member pass away. No issues with machine, mask fit or pressure setting. Sleeping well through the night with less snoring. Takes naps during the day but does not use CPAP then. No trouble breathing. He is still smoking 0.75 ppd, he would like to quit and states that the [place] gave him nicotine  patches. His wife is his support system. Denies shortness of breath, chest tightness or wheezing.   Airview download 02/11/19-03/12/19: Usage 12/30 days; 12 days > 4 hours Average usage days used- 6 hours 14 mins Pressure 5-15cm h20 (14.5- 95%) Leaks 28.9L/min AHI 3.2   11/10/2019 Patient presents today for 1-month follow-up CT imaging and OSA. Patient is current smoker, he is apart of our lung cancer screening program. He had annual low-dose CT chest on 11/07/2019 that showed lung RADS 2, right upper lobe pulmonary nodule 2.6 mm which is not significantly changed. Discussed smoking cessation  with him today.   In regards to his sleep apnea he remains moderately compliant with CPAP. No issues with pressure setting or mask fit. He is sleeping well, gets approx 7 hours at night. He tends to not wear his CPAP on days when he needs to bring his wife to doctors apt the following day. States that he sleeps so well with CPAP he is worried about over sleeping or not waking up at night if she needs him. He drives his wife on Wednesday to clinic in [place] for wound check/dressing change lower on her lower extremity.   Airview download 10/10/19-11/08/19: Usage days 17/30 (57%) Average usage 5 hours 46 mins  Pressure 5-15cm h20 (13.4cm h20-95%) Leaks 21.5L/min (95%) AHI 1.9   10/17/2022 Patient presents today for overdue follow-up OSA. CPAP compliance in the past had been inconsistent at times. He reports sleeping so well when he wears his CPAP that he is worried about over sleeping or not waking up at night if his wife needs him. He gets approximately 7 hours of sleep a night. Snoring symptoms are keeping his wife awake at night. He has not worn his CPAP in the last 3-4 months. His machine is > 68 years old and stopped working. No issues with pressure setting.  Bedtime is 1:30 AM. It takes him 5-20mins to fall asleep. Starts his day at 9am. Weight is down 15 lbs. Epworth score 5/24.   He has a chronic cough. No shortness of breath. Able  to do all ADLs. Current smoker. He was following with lung cancer screening program up until 2022, no longer qualifies for screening program due to age. LDCT 12/28/20 >> showed lung RADS 2, benign appearance or behavior. Emphysema.   OSA (obstructive sleep apnea) - HST 08/2017 showed moderate OSA with 18/h. Maintained on auto CPAP 5-15cm h20. CPAP stopped working 3-4 months ago. Needs order for new machine. No changes recommended to pressure settings. DME company is [employer]. FU in 3 months for compliance check.    Centrilobular emphysema (HCC) - Chronic cough,  no dyspnea  - Centrilobular and paraseptal emphysema on CT imaging - Trial Spiriva Respimat 2.5mcg two puff daily x 2 weeks, if beneficial advised patient notify offivce and we will send in RX    01/16/2023  Discussed the use of AI scribe software for clinical note transcription with the patient, who gave verbal consent to proceed.  History of Present Illness   The patient, with a history of sleep apnea, emphysema, and tobacco abuse, presents for a follow-up visit after receiving a new CPAP machine approximately a month ago. He reports consistent nightly use of the new machine without any issues and notes an improvement in sleep quality. Despite this, the patient continues to experience some apneic events, albeit fewer than during his initial sleep study.  The patient denies any changes to his medication regimen, which includes aspirin , metformin , pravastatin , valsartan, and eye drops. He takes no sleep aids and does not drink alcohol. He denies any nocturnal symptoms such as shortness of breath and reports stable weight and absence of leg swelling.  Regarding his sleep pattern, the patient reports variable bedtime, usually around midnight or later, and wakes up between seven and nine in the morning, feeling rested. He occasionally takes daytime naps but does not use the CPAP machine during these times. He denies any instances of falling asleep while driving.  In terms of his emphysema, the patient reports an occasional cough but denies it being bothersome or interfering with his daily life. He never tried sample of Stiolto. He denies any significant respiratory symptoms. He continues to smoke, consuming approximately half a pack per day, but expresses readiness to consider quitting. He has tried patches in the past. He has been part of a lung cancer screening program and his CT scans have shown no suspicious pulmonary nodules.     Airview download 01/05/23-01/14/23 Usage 10/10 days (100%) Average  usage days used 5 hours 35 mins  Pressure 5-15cm h20 (10.8cm h20-95%) Airleaks 9.5L/min (95%) Apnea index- central 5.2/obstructive 2.5 AHI 8.5    06/01/2023 Discussed the use of AI scribe software for clinical note transcription with the patient, who gave verbal consent to proceed.  History of Present Illness   Matthew Hooper is an 83 year old male with sleep apnea who presents for follow-up on CPAP compliance and management of chronic cough.  He has a history of moderate obstructive sleep apnea diagnosed in 2019, with a sleep study showing 18 apneic events per hour. He was started on auto CPAP therapy. Over the last 90 days, his CPAP compliance has been about 50%, largely due to hospitalizations and a subsequent episode of bronchitis in March, during which he did not use the CPAP for approximately three weeks. He does not use the CPAP on nights before appointments to avoid oversleeping. He has been more consistent with CPAP use in April.  He experiences a chronic cough, which he attributes to difficulty using the CPAP. He has occasional coughing spells  and sometimes needs to expectorate. He has a history of smoking for 70 years and is a current everyday smoker. He recalls using Tessalon  Perles in the past, which helped alleviate the cough slightly. He has a history of emphysema noted on a CT scan and has been given an inhaler Stage manager) previously, which he has not used due to lack of symptoms at the time. He has not had a formal breathing test to assess for COPD, although there has been suspicion of the condition. He has a history of emphysema.  He reports snoring, particularly when not using the CPAP. His wife confirms that he snores more when the CPAP is not in use. He shares a room with her, and she notes that his snoring affects her sleep.  He has a long history of smoking but does not smoke in the house or car. His wife quit smoking 30 years ago. He wants to quit smoking and is considering options  such as nicotine  patches or gum.      Airview download 02/27/23-05/27/23 Usage days 44/90 (49%); 44 days (49%) >4 hours Average usage days used 5 hours 52 mins Pressure 9cm h20 Airleaks 19L/min (95%) AHI 3.5    08/12/2023- Interim hx  Discussed the use of AI scribe software for clinical note transcription with the patient, who gave verbal consent to proceed.  History of Present Illness   Matthew Hooper is an 83 year old male with sleep apnea and a chronic cough who presents for follow-up of sleep study and breathing test results.  He has a history of moderate sleep apnea diagnosed in 2019, with an average of eighteen apneic events per hour. He was started on CPAP therapy but has struggled with compliance, using it approximately 62% of the time over the last three months. He sometimes falls asleep without putting it on, but when he does use it, he sleeps well. He recently acquired a new mask. He does not feel that the pressure is insufficient and wakes up feeling good every morning. His bedtime routine is irregular, often going to bed around 2 AM after falling asleep on the sofa while watching TV.  He reports a chronic cough that has recently resolved. He was previously given an inhaler but has not used it. He recalls having asthma as a teenager, possibly exercise-induced, but currently has no shortness of breath or trouble breathing. He smokes and acknowledges this habit. He is not currently bothered by any symptoms and feels he is in good shape.   Airview download 05/13/23-08/10/23 Usage days 56/90 days Average usage days used 5 hours 18 mins Pressure 13cm h20 Airleaks 36.5L/min AHI 5.3    PFTs 08/12/2023  FVC 2.80 (62%), FEV1 2.02 (63%), ratio 72 Moderate obstruction with positive BD response   Imaging: LDCT 10/29/18 >> Lung- RADS 2 (08/2017 - RADS 1S).  LDCT 11/07/19 >> Lung - RADS 2, benign appearance or behavior  LDCT 12/28/20 >> Lung- RADS2, benign appearance or behavior. Centrilobular and  paraseptal emphysema. Calcified left lower lobe granuloma. 2.4 mm posterior right lower lobe nodule, unchanged. No new or suspicious pulmonary nodules.   No Known Allergies  Immunization History  Administered Date(s) Administered   Fluad Quad(high Dose 65+) 11/11/2018   Influenza, High Dose Seasonal PF 11/02/2015, 01/10/2017   Influenza,inj,Quad PF,6+ Mos 11/10/2017   Influenza-Unspecified 11/10/2017   PFIZER(Purple Top)SARS-COV-2 Vaccination 03/26/2019, 04/18/2019, 12/12/2019, 06/25/2020   Pfizer Covid-19 Vaccine Bivalent Booster 2yrs & up 12/04/2020, 09/02/2021   Pfizer(Comirnaty)Fall Seasonal Vaccine 12 years and older  01/31/2022   Pneumococcal Conjugate-13 10/10/2016   Pneumococcal Polysaccharide-23 01/10/2015    Past Medical History:  Diagnosis Date   Diabetes mellitus without complication (HCC)    Glaucoma 04/17/2021   Hyperlipidemia    Hypertension     Tobacco History: Social History   Tobacco Use  Smoking Status Every Day   Current packs/day: 0.75   Average packs/day: 0.8 packs/day for 62.0 years (46.5 ttl pk-yrs)   Types: Cigarettes  Smokeless Tobacco Never  Tobacco Comments   Pt smokes 6-7 cigs daily. AB, CMA 08-12-23      Less than 1/2 per day - AB, CMA 06-01-23   Ready to quit: Not Answered Counseling given: Not Answered Tobacco comments: Pt smokes 6-7 cigs daily. AB, CMA 08-12-23  Less than 1/2 per day - AB, CMA 06-01-23   Outpatient Medications Prior to Visit  Medication Sig Dispense Refill   aspirin  81 MG EC tablet Take 1 tablet by mouth daily.     benzonatate  (TESSALON ) 100 MG capsule Take 1 capsule (100 mg total) by mouth 3 (three) times daily as needed for cough. 30 capsule 1   COSOPT 2-0.5 % ophthalmic solution 1 drop 2 (two) times daily.     D3-50 1.25 MG (50000 UT) capsule Take 50,000 Units by mouth daily.     dorzolamide  (TRUSOPT ) 2 % ophthalmic solution Place 1 drop into both eyes every evening.     Lancets (ONETOUCH DELICA PLUS LANCET33G)  MISC Apply 1 each topically daily.     latanoprost  (XALATAN ) 0.005 % ophthalmic solution Place 1 drop into both eyes at bedtime.     metFORMIN  (GLUCOPHAGE ) 1000 MG tablet Take 1,000 mg by mouth 2 (two) times daily with a meal.     oseltamivir  (TAMIFLU ) 75 MG capsule Take 1 capsule (75 mg total) by mouth every 12 (twelve) hours. 10 capsule 0   pravastatin  (PRAVACHOL ) 80 MG tablet Take 80 mg by mouth at bedtime.     valsartan (DIOVAN) 160 MG tablet Take 160 mg by mouth daily.      umeclidinium-vilanterol (ANORO ELLIPTA ) 62.5-25 MCG/ACT AEPB Inhale 1 puff into the lungs daily. (Patient not taking: Reported on 08/12/2023) 60 each 3   No facility-administered medications prior to visit.   Review of Systems  Review of Systems  Constitutional: Negative.  Negative for fatigue.  Respiratory: Negative.  Negative for cough, shortness of breath and wheezing.    Physical Exam  BP 126/64 (BP Location: Left Arm, Patient Position: Sitting, Cuff Size: Normal)   Pulse 70   Temp (!) 97.4 F (36.3 C) (Temporal)   Ht 6' 1 (1.854 m)   Wt 201 lb (91.2 kg)   SpO2 97%   BMI 26.52 kg/m  Physical Exam Constitutional:      General: He is not in acute distress.    Appearance: Normal appearance. He is not ill-appearing.  HENT:     Head: Normocephalic and atraumatic.  Cardiovascular:     Rate and Rhythm: Normal rate and regular rhythm.  Pulmonary:     Effort: Pulmonary effort is normal.     Breath sounds: Rhonchi present. No wheezing.  Musculoskeletal:        General: Normal range of motion.  Skin:    General: Skin is warm and dry.  Neurological:     General: No focal deficit present.     Mental Status: He is alert and oriented to person, place, and time. Mental status is at baseline.  Psychiatric:  Mood and Affect: Mood normal.        Behavior: Behavior normal.        Thought Content: Thought content normal.        Judgment: Judgment normal.      Lab Results:  CBC    Component Value  Date/Time   WBC 4.8 04/13/2023 1852   RBC 4.91 04/13/2023 1852   HGB 13.0 04/13/2023 1852   HCT 39.9 04/13/2023 1852   PLT 180 04/13/2023 1852   MCV 81.3 04/13/2023 1852   MCH 26.5 04/13/2023 1852   MCHC 32.6 04/13/2023 1852   RDW 15.0 04/13/2023 1852   LYMPHSABS 0.4 (L) 04/13/2023 1852   MONOABS 0.6 04/13/2023 1852   EOSABS 0.0 04/13/2023 1852   BASOSABS 0.0 04/13/2023 1852    BMET    Component Value Date/Time   NA 132 (L) 04/13/2023 1852   K 3.3 (L) 04/13/2023 1852   CL 101 04/13/2023 1852   CO2 23 04/13/2023 1852   GLUCOSE 153 (H) 04/13/2023 1852   BUN 14 04/13/2023 1852   CREATININE 1.23 04/13/2023 1852   CALCIUM 8.5 (L) 04/13/2023 1852   GFRNONAA 59 (L) 04/13/2023 1852    BNP No results found for: BNP  ProBNP No results found for: PROBNP  Imaging: No results found.   Assessment & Plan:   1. Centrilobular emphysema (HCC) (Primary)  2. 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 Currently asymptomatic with no shortness of breath or cough.  - Encourage smoking cessation. - Hold off on scheduled bronchodilators as he is currently asymptomatic. - Advise to report if symptoms such as cough or shortness of breath return, at which point inhaler use may be recommended.  Moderate sleep apnea Moderate sleep apnea diagnosed in 2019 with an average of 18 apneic events per hour. Currently using CPAP with 62% compliance over the last three months. Compliance issues due to irregular sleep routine and sometimes forgetting to use the CPAP. Recently acquired a new mask which may improve compliance. - Encourage consistent use of CPAP nightly for a minimum of four to six hours or longer. - Advise to establish a regular bedtime routine to improve CPAP compliance.   Almarie LELON Ferrari, NP 08/12/2023

## 2023-11-12 ENCOUNTER — Ambulatory Visit (INDEPENDENT_AMBULATORY_CARE_PROVIDER_SITE_OTHER)

## 2023-11-12 ENCOUNTER — Encounter: Payer: Self-pay | Admitting: Podiatry

## 2023-11-12 ENCOUNTER — Ambulatory Visit (INDEPENDENT_AMBULATORY_CARE_PROVIDER_SITE_OTHER): Admitting: Podiatry

## 2023-11-12 VITALS — Ht 73.0 in | Wt 201.0 lb

## 2023-11-12 DIAGNOSIS — M79675 Pain in left toe(s): Secondary | ICD-10-CM | POA: Diagnosis not present

## 2023-11-12 DIAGNOSIS — B351 Tinea unguium: Secondary | ICD-10-CM | POA: Diagnosis not present

## 2023-11-12 DIAGNOSIS — L97511 Non-pressure chronic ulcer of other part of right foot limited to breakdown of skin: Secondary | ICD-10-CM

## 2023-11-12 DIAGNOSIS — E119 Type 2 diabetes mellitus without complications: Secondary | ICD-10-CM

## 2023-11-12 DIAGNOSIS — M79674 Pain in right toe(s): Secondary | ICD-10-CM

## 2023-11-12 MED ORDER — DOXYCYCLINE HYCLATE 100 MG PO TABS: 100.0000 mg | ORAL_TABLET | Freq: Two times a day (BID) | ORAL | 0 refills | Status: AC

## 2023-11-12 MED ORDER — MUPIROCIN 2 % EX OINT: 1.0000 | TOPICAL_OINTMENT | Freq: Two times a day (BID) | CUTANEOUS | 2 refills | Status: AC

## 2023-11-12 NOTE — Patient Instructions (Signed)
 Monitor for any signs/symptoms of infection. Call the office immediately if any occur or go directly to the emergency room. Call with any questions/concerns.

## 2023-11-12 NOTE — Progress Notes (Signed)
 Subjective: Chief Complaint  Patient presents with   Nail Problem    Pt is here for [facility].     83 y.o. returns the office today for painful, elongated, thickened toenails which he cannot trim himself.   He also reports an ulceration to the bottom of his right foot he is not sure of how long this has been there.  He thinks it started off as a blister.  His wife keeps an ointment on the area he reports but denies any drainage or pus.  No increase in swelling or redness.  No fevers or chills.  PCP: DR. Josette Brinks, PA-C Last seen: November 02, 2023  Last A1c 6.7 on November 02, 2023  Objective: AAO 3, NAD DP/PT pulses palpable, CRT less than 3 seconds Nails hypertrophic, dystrophic, elongated, brittle, discolored 10. There is tenderness overlying the nails 1-5 bilaterally. There is no surrounding erythema or drainage along the nail sites. Superficial layer skin breakdown as pictured below in the plantar aspect of the right foot.  There are some peeling, macerated tissue with superficial granular wound underneath.  There is no probing, undermining or tunneling.  There is no fluctuation or crepitation.  There is no monitor.  Trace edema present. No pain with calf compression, swelling, warmth, erythema.    Assessment: Symptomatic onychomycosis; ulceration right foot  Plan: Symptomatic onychomycosis -Nails sharply debrided 10 without complication/bleeding. -RTC 3 months  Ulceration right foot -Sharply debrided the skin lesion with a 312 with scalpel and complications.  Recommended antibiotic ointment dressing changes daily prescribed mupirocin.  Start doxycycline as well.  Recommend surgical shoe for offloading which was dispensed to facilitate healing.SABRA  He has a referral to the wound care center.  Follow-up in 2 weeks for the wound and the sequence of the wound care center.   Matthew Hooper DPM

## 2024-02-02 ENCOUNTER — Ambulatory Visit (INDEPENDENT_AMBULATORY_CARE_PROVIDER_SITE_OTHER): Admitting: Primary Care

## 2024-02-02 ENCOUNTER — Encounter: Payer: Self-pay | Admitting: Primary Care

## 2024-02-02 VITALS — BP 126/68 | HR 71 | Temp 97.2°F | Ht 74.5 in | Wt 200.4 lb

## 2024-02-02 DIAGNOSIS — F17210 Nicotine dependence, cigarettes, uncomplicated: Secondary | ICD-10-CM | POA: Diagnosis not present

## 2024-02-02 DIAGNOSIS — G4733 Obstructive sleep apnea (adult) (pediatric): Secondary | ICD-10-CM | POA: Diagnosis not present

## 2024-02-02 DIAGNOSIS — J432 Centrilobular emphysema: Secondary | ICD-10-CM

## 2024-02-02 MED ORDER — SPIRIVA RESPIMAT 2.5 MCG/ACT IN AERS: 2.0000 | INHALATION_SPRAY | Freq: Every day | RESPIRATORY_TRACT | Status: AC

## 2024-02-02 NOTE — Addendum Note (Signed)
 Addended by: Amyra Vantuyl T on: 02/02/2024 11:35 AM   Modules accepted: Orders

## 2024-02-02 NOTE — Progress Notes (Signed)
 "  @Patient  ID: Matthew Hooper, male    DOB: [birth year], 83 y.o.   MRN: [id number]  Chief Complaint  Patient presents with   Obstructive Sleep Apnea    Referring provider: Sophronia Ozell BROCKS, MD  HPI:  83 year old MALE, current smoker. PMH significant for OSA, tobacco abuse. Patient of Dr. Jude. HST 08/2017 showed moderate OSA with 18/h. Maintained on auto CPAP 5-15cm h20.   Previous [facility] pulmonary encounter: 09/07/2018 Patient presents today for OSA 6 month follow-up. He is doing well, no complaints. Moderate compliance with CPAP. States that he forgets to put mask on a few times because he falls asleep on the couch. He reports sleeping well at night with less snoring. Feels well rested when he wakes up in the morning.   Airview download:  14/30 days (47%); 14 days >4 hours.  Pressure 5-15cm H20 (12.2) AHI 2.2   03/15/2019 Patient presents today for 6 month OSA follow-up. He is doing well, no acute complaints. He is mostly compliant with CPAP. States that he has had early doctors appointment and had recent family member pass away. No issues with machine, mask fit or pressure setting. Sleeping well through the night with less snoring. Takes naps during the day but does not use CPAP then. No trouble breathing. He is still smoking 0.75 ppd, he would like to quit and states that the [place] gave him nicotine  patches. His wife is his support system. Denies shortness of breath, chest tightness or wheezing.   Airview download 02/11/19-03/12/19: Usage 12/30 days; 12 days > 4 hours Average usage days used- 6 hours 14 mins Pressure 5-15cm h20 (14.5- 95%) Leaks 28.9L/min AHI 3.2   11/10/2019 Patient presents today for 25-month follow-up CT imaging and OSA. Patient is current smoker, he is apart of our lung cancer screening program. He had annual low-dose CT chest on 11/07/2019 that showed lung RADS 2, right upper lobe pulmonary nodule 2.6 mm which is not significantly changed. Discussed smoking cessation with him  today.   In regards to his sleep apnea he remains moderately compliant with CPAP. No issues with pressure setting or mask fit. He is sleeping well, gets approx 7 hours at night. He tends to not wear his CPAP on days when he needs to bring his wife to doctors apt the following day. States that he sleeps so well with CPAP he is worried about over sleeping or not waking up at night if she needs him. He drives his wife on Wednesday to clinic in [place] for wound check/dressing change lower on her lower extremity.   Airview download 10/10/19-11/08/19: Usage days 17/30 (57%) Average usage 5 hours 46 mins  Pressure 5-15cm h20 (13.4cm h20-95%) Leaks 21.5L/min (95%) AHI 1.9   10/17/2022 Patient presents today for overdue follow-up OSA. CPAP compliance in the past had been inconsistent at times. He reports sleeping so well when he wears his CPAP that he is worried about over sleeping or not waking up at night if his wife needs him. He gets approximately 7 hours of sleep a night. Snoring symptoms are keeping his wife awake at night. He has not worn his CPAP in the last 3-4 months. His machine is > 65 years old and stopped working. No issues with pressure setting.  Bedtime is 1:30 AM. It takes him 5-57mins to fall asleep. Starts his day at 9am. Weight is down 15 lbs. Epworth score 5/24.   He has a chronic cough. No shortness of breath. Able to do all ADLs.  Current smoker. He was following with lung cancer screening program up until 2022, no longer qualifies for screening program due to age. LDCT 12/28/20 >> showed lung RADS 2, benign appearance or behavior. Emphysema.   OSA (obstructive sleep apnea) - HST 08/2017 showed moderate OSA with 18/h. Maintained on auto CPAP 5-15cm h20. CPAP stopped working 3-4 months ago. Needs order for new machine. No changes recommended to pressure settings. DME company is [employer]. FU in 3 months for compliance check.    Centrilobular emphysema (HCC) - Chronic cough, no dyspnea   - Centrilobular and paraseptal emphysema on CT imaging - Trial Spiriva  Respimat 2.5mcg two puff daily x 2 weeks, if beneficial advised patient notify offivce and we will send in RX    01/16/2023  Discussed the use of AI scribe software for clinical note transcription with the patient, who gave verbal consent to proceed.  History of Present Illness   The patient, with a history of sleep apnea, emphysema, and tobacco abuse, presents for a follow-up visit after receiving a new CPAP machine approximately a month ago. He reports consistent nightly use of the new machine without any issues and notes an improvement in sleep quality. Despite this, the patient continues to experience some apneic events, albeit fewer than during his initial sleep study.  The patient denies any changes to his medication regimen, which includes aspirin , metformin , pravastatin , valsartan, and eye drops. He takes no sleep aids and does not drink alcohol. He denies any nocturnal symptoms such as shortness of breath and reports stable weight and absence of leg swelling.  Regarding his sleep pattern, the patient reports variable bedtime, usually around midnight or later, and wakes up between seven and nine in the morning, feeling rested. He occasionally takes daytime naps but does not use the CPAP machine during these times. He denies any instances of falling asleep while driving.  In terms of his emphysema, the patient reports an occasional cough but denies it being bothersome or interfering with his daily life. He never tried sample of Stiolto. He denies any significant respiratory symptoms. He continues to smoke, consuming approximately half a pack per day, but expresses readiness to consider quitting. He has tried patches in the past. He has been part of a lung cancer screening program and his CT scans have shown no suspicious pulmonary nodules.     Airview download 01/05/23-01/14/23 Usage 10/10 days (100%) Average usage days  used 5 hours 35 mins  Pressure 5-15cm h20 (10.8cm h20-95%) Airleaks 9.5L/min (95%) Apnea index- central 5.2/obstructive 2.5 AHI 8.5    02/02/2024- Interim hx  Discussed the use of AI scribe software for clinical note transcription with the patient, who gave verbal consent to proceed.  History of Present Illness Dudley Mages is an 83 year old male with emphysema and moderate sleep apnea who presents for a six-month follow-up. He is accompanied by his wife.  Over the past six months, he has been managing his breathing adequately. Despite being diagnosed with moderate sleep apnea in 2019, with an average of 18 apneic events per hour, he has not been using his CPAP machine consistently. His compliance was about 62% in the summer, but irregularities in his sleep routine and forgetfulness have affected usage. He reports improved sleep and has lost weight, from 225 pounds in 2019 to 200 pounds currently.  He has a history of emphysema or COPD with asthma overlap. His wife notes a persistent cough, sometimes producing mucus, but he denies shortness of breath or fatigue. He  continues to smoke about a third of a pack per day and plans to quit by Joylene Snook. He has not used any scheduled bronchodilators.  A chest x-ray in March 2025 was normal. He was in the emergency room in March 2025 for influenza A, which resolved well. He has not used Mucinex  regularly, only during colds.  He has not used the Spiriva  inhaler previously provided. He has not experienced wheezing, but he does have some congestion that clears with coughing. He has not been using any medication for congestion regularly.  He has received the flu shot this year but has not yet received the RSV vaccine.   Allergies[1]  Immunization History  Administered Date(s) Administered   Fluad Quad(high Dose 65+) 11/11/2018   INFLUENZA, HIGH DOSE SEASONAL PF 11/02/2015, 01/10/2017   Influenza,inj,Quad PF,6+ Mos 11/10/2017   Influenza-Unspecified  11/10/2017   PFIZER(Purple Top)SARS-COV-2 Vaccination 03/26/2019, 04/18/2019, 12/12/2019, 06/25/2020   Pfizer Covid-19 Vaccine Bivalent Booster 42yrs & up 12/04/2020, 09/02/2021   Pfizer(Comirnaty)Fall Seasonal Vaccine 12 years and older 01/31/2022, 01/11/2023, 11/27/2023   Pneumococcal Conjugate-13 10/10/2016   Pneumococcal Polysaccharide-23 01/10/2015    Past Medical History:  Diagnosis Date   Diabetes mellitus without complication (HCC)    Glaucoma 04/17/2021   Hyperlipidemia    Hypertension     Tobacco History: Tobacco Use History[2] Ready to quit: Not Answered Counseling given: Not Answered Tobacco comments: Pt smokes 6-7 cigs daily. AB, CMA 08-12-23  Less than 1/2 per day - AB, CMA 06-01-23   Outpatient Medications Prior to Visit  Medication Sig Dispense Refill   aspirin  81 MG EC tablet Take 1 tablet by mouth daily.     COSOPT 2-0.5 % ophthalmic solution 1 drop 2 (two) times daily.     D3-50 1.25 MG (50000 UT) capsule Take 50,000 Units by mouth daily.     dorzolamide  (TRUSOPT ) 2 % ophthalmic solution Place 1 drop into both eyes every evening.     Lancets (ONETOUCH DELICA PLUS LANCET33G) MISC Apply 1 each topically daily.     latanoprost  (XALATAN ) 0.005 % ophthalmic solution Place 1 drop into both eyes at bedtime.     metFORMIN  (GLUCOPHAGE ) 1000 MG tablet Take 1,000 mg by mouth 2 (two) times daily with a meal.     mupirocin  ointment (BACTROBAN ) 2 % Apply 1 Application topically 2 (two) times daily. 30 g 2   oseltamivir  (TAMIFLU ) 75 MG capsule Take 1 capsule (75 mg total) by mouth every 12 (twelve) hours. 10 capsule 0   pravastatin  (PRAVACHOL ) 80 MG tablet Take 80 mg by mouth at bedtime.     valsartan (DIOVAN) 160 MG tablet Take 160 mg by mouth daily.      benzonatate  (TESSALON ) 100 MG capsule Take 1 capsule (100 mg total) by mouth 3 (three) times daily as needed for cough. (Patient not taking: Reported on 02/02/2024) 30 capsule 1   doxycycline  (VIBRA -TABS) 100 MG tablet Take  1 tablet (100 mg total) by mouth 2 (two) times daily. (Patient not taking: Reported on 02/02/2024) 20 tablet 0   umeclidinium-vilanterol (ANORO ELLIPTA ) 62.5-25 MCG/ACT AEPB Inhale 1 puff into the lungs daily. (Patient not taking: Reported on 02/02/2024) 60 each 3   No facility-administered medications prior to visit.   Review of Systems  Review of Systems  Constitutional: Negative.  Negative for fatigue.  Respiratory:  Positive for cough. Negative for chest tightness, shortness of breath and wheezing.   Psychiatric/Behavioral: Negative.  Negative for sleep disturbance.    Physical Exam  BP 126/68  Pulse 71   Temp (!) 97.2 F (36.2 C)   Ht 6' 2.5 (1.892 m) Comment: PT STATED  Wt 200 lb 6.4 oz (90.9 kg)   SpO2 98% Comment: RA  BMI 25.39 kg/m  Physical Exam Constitutional:      Appearance: Normal appearance.  HENT:     Head: Normocephalic and atraumatic.  Cardiovascular:     Rate and Rhythm: Normal rate and regular rhythm.     Pulses: Normal pulses.  Pulmonary:     Effort: Pulmonary effort is normal.     Breath sounds: Normal breath sounds.     Comments: Upper airway congestion, clears with cough. Lung sounds CTA Neurological:     General: No focal deficit present.     Mental Status: He is alert and oriented to person, place, and time. Mental status is at baseline.  Psychiatric:        Mood and Affect: Mood normal.        Behavior: Behavior normal.        Thought Content: Thought content normal.        Judgment: Judgment normal.      Lab Results:  CBC    Component Value Date/Time   WBC 4.8 04/13/2023 1852   RBC 4.91 04/13/2023 1852   HGB 13.0 04/13/2023 1852   HCT 39.9 04/13/2023 1852   PLT 180 04/13/2023 1852   MCV 81.3 04/13/2023 1852   MCH 26.5 04/13/2023 1852   MCHC 32.6 04/13/2023 1852   RDW 15.0 04/13/2023 1852   LYMPHSABS 0.4 (L) 04/13/2023 1852   MONOABS 0.6 04/13/2023 1852   EOSABS 0.0 04/13/2023 1852   BASOSABS 0.0 04/13/2023 1852    BMET     Component Value Date/Time   NA 132 (L) 04/13/2023 1852   K 3.3 (L) 04/13/2023 1852   CL 101 04/13/2023 1852   CO2 23 04/13/2023 1852   GLUCOSE 153 (H) 04/13/2023 1852   BUN 14 04/13/2023 1852   CREATININE 1.23 04/13/2023 1852   CALCIUM 8.5 (L) 04/13/2023 1852   GFRNONAA 59 (L) 04/13/2023 1852    BNP No results found for: BNP  ProBNP No results found for: PROBNP  Imaging: No results found.   Assessment & Plan:   No problem-specific Assessment & Plan notes found for this encounter.   1. Centrilobular emphysema (HCC) (Primary)  2. 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 Reports increased cough with occasional mucus production, but no shortness of breath or wheezing. Previous chest x-ray in March was normal. No recent exacerbations. Smoking cessation is a priority to prevent further lung damage. - Provided sample of Spiriva  inhaler for trial use to assess impact on cough. - Advised use of Mucinex  for 5-7 days to help with congestion. - Provided flutter valve for use 2-3 times daily to aid in clearing lung congestion. - Advised follow-up in 6 months or sooner if symptoms worsen. - Advised obtaining RSV vaccine.  Obstructive sleep apnea Moderate obstructive sleep apnea diagnosed in 2019 with an average of 18 apneic events per hour. Previous CPAP compliance was fair at 62%. Reports improved sleep without CPAP use, possibly due to weight loss from 225 lbs to 200 lbs. No current snoring or apneic episodes reported. Discussed potential risks of untreated sleep apnea, including cardiac arrhythmia, stroke, pulmonary hypertension, and diabetes. - Ordered home sleep study to  reassess severity of sleep apnea without CPAP use.  Nicotine  dependence, cigarettes Continues to smoke  approximately one-third of a pack per day. Plans to quit smoking by January 2026. Previous inconsistent use of nicotine  patches. Smoking cessation is crucial to improve lung health and overall well-being. - Prescribed nicotine  patch: 14 mg once daily for 4-6 weeks, then 7 mg daily for 2-4 weeks, then stop. - Encouraged setting a quit date for January 2026.   Smoking/Tobacco Cessation Counseling Lemuel Boodram is a current user of tobacco or nicotine  products. He is ready to quit at this time. Counseling provided today addressed the risks of continued use and the benefits of cessation. Discussed tobacco/nicotine  use history, readiness to quit, and evidence-based treatment options including behavioral strategies, support resources, and pharmacologic therapies. Provided encouragement and educational materials on steps and resources to quit smoking. Patient questions were addressed, and follow-up recommended for continued support. Total time spent on counseling: 5 minutes.   Almarie LELON Ferrari, NP 02/02/2024     [1] No Known Allergies [2]  Social History Tobacco Use  Smoking Status Every Day   Current packs/day: 0.75   Average packs/day: 0.8 packs/day for 62.0 years (46.5 ttl pk-yrs)   Types: Cigarettes  Smokeless Tobacco Never  Tobacco Comments   Pt smokes 6-7 cigs daily. AB, CMA 08-12-23      Less than 1/2 per day - AB, CMA 06-01-23   "

## 2024-02-02 NOTE — Patient Instructions (Addendum)
" °  VISIT SUMMARY: Today, you came in for your six-month follow-up appointment. We discussed your emphysema, sleep apnea, and smoking habits. You have been managing your breathing well, but there are areas where we can improve your health further. You have lost weight and reported better sleep, but we need to address your inconsistent use of the CPAP machine and smoking cessation.  YOUR PLAN: -CENTRILOBULAR EMPHYSEMA WITH CHRONIC BRONCHITIS (COPD): COPD is a chronic lung disease that makes it hard to breathe. You have been experiencing increased coughing with occasional mucus. We provided you with a sample of the Spiriva  inhaler to see if it helps with your cough. You should use Mucinex  for 5-7 days to help with congestion and use the flutter valve 2-3 times daily to clear lung congestion. Quitting smoking is crucial, so we recommend using a nicotine  patch: 14 mg once daily for 4-6 weeks, then 7 mg daily for 2-4 weeks, and then stop. Please follow up in 6 months or sooner if your symptoms worsen.  -OBSTRUCTIVE SLEEP APNEA: Sleep apnea is a condition where your breathing stops and starts during sleep. You have moderate sleep apnea and have not been using your CPAP machine consistently. We ordered a home sleep study to reassess the severity of your sleep apnea without the CPAP machine. Please also get the RSV vaccine.  -NICOTINE  DEPENDENCE, CIGARETTES: Nicotine  dependence means you are addicted to the nicotine  in cigarettes. You are currently smoking about one-third of a pack per day and plan to quit by January 2026. We prescribed a nicotine  patch: 14 mg once daily for 4-6 weeks, then 7 mg daily for 2-4 weeks, and then stop. Setting a quit date for January 2026 is encouraged.  INSTRUCTIONS: Please follow up in 6 months or sooner if your symptoms worsen. Additionally, complete the home sleep study and obtain the RSV vaccine.  Orders: Sample Spiriva  respimat Flutter valve  Follow-up 6 months or sooner if  needed  "

## 2024-02-18 ENCOUNTER — Encounter

## 2024-02-18 ENCOUNTER — Ambulatory Visit: Admitting: Podiatry

## 2024-02-18 ENCOUNTER — Encounter: Payer: Self-pay | Admitting: Podiatry

## 2024-02-18 VITALS — Ht 74.5 in | Wt 200.4 lb

## 2024-02-18 DIAGNOSIS — Z872 Personal history of diseases of the skin and subcutaneous tissue: Secondary | ICD-10-CM

## 2024-02-18 DIAGNOSIS — Q666 Other congenital valgus deformities of feet: Secondary | ICD-10-CM | POA: Diagnosis not present

## 2024-02-18 DIAGNOSIS — M79674 Pain in right toe(s): Secondary | ICD-10-CM | POA: Diagnosis not present

## 2024-02-18 DIAGNOSIS — B351 Tinea unguium: Secondary | ICD-10-CM

## 2024-02-18 DIAGNOSIS — E1142 Type 2 diabetes mellitus with diabetic polyneuropathy: Secondary | ICD-10-CM

## 2024-02-18 DIAGNOSIS — Z0189 Encounter for other specified special examinations: Secondary | ICD-10-CM

## 2024-02-18 DIAGNOSIS — E119 Type 2 diabetes mellitus without complications: Secondary | ICD-10-CM | POA: Diagnosis not present

## 2024-02-18 DIAGNOSIS — G4733 Obstructive sleep apnea (adult) (pediatric): Secondary | ICD-10-CM

## 2024-02-18 DIAGNOSIS — M79675 Pain in left toe(s): Secondary | ICD-10-CM | POA: Diagnosis not present

## 2024-02-18 NOTE — Progress Notes (Unsigned)
 Subjective: Chief Complaint  Patient presents with   Nail Problem    Pt is here for diabetic foot care.      84 y.o. returns the office today for painful, elongated, thickened toenails which he cannot trim himself.   Ulceration is healed.  He was seen dermatology and states he had a biopsy.   Asking about diabetic shoes.  PCP: DR. Josette Brinks, PA-C Last seen: November 02, 2023  Last A1c 6.7 on November 02, 2023  Objective: AAO 3, NAD DP/PT pulses palpable, CRT less than 3 seconds Sensation decreased mildly with SWMF. Nails hypertrophic, dystrophic, elongated, brittle, discolored 10. There is tenderness overlying the nails 1-5 bilaterally. There is no surrounding erythema or drainage along the nail sites. Minimal callus on the plantar aspect the right foot but there is no ulceration identified at this time.  There is no open lesions. Flatfoot is present. No pain with calf compression, swelling, warmth, erythema.   Assessment: Symptomatic onychomycosis; diabetic foot exam  Plan: Symptomatic onychomycosis -Nails sharply debrided 10 without complication/bleeding. -RTC 3 months  History of ulceration, type 2 diabetes - Currently ulceration.  Discussed daily foot inspection.  Order for diabetic shoes.  Donnice JONELLE Fees DPM

## 2024-02-24 ENCOUNTER — Encounter: Payer: Self-pay | Admitting: Podiatry

## 2024-02-24 NOTE — Progress Notes (Signed)
 Received paperwork from [facility] for shoes. Completed necessary paperwork

## 2024-03-04 ENCOUNTER — Telehealth: Payer: Self-pay | Admitting: Pulmonary Disease

## 2024-03-04 DIAGNOSIS — R0683 Snoring: Secondary | ICD-10-CM | POA: Diagnosis not present

## 2024-03-04 NOTE — Telephone Encounter (Signed)
 HST showed mod OSA with AHI 18/ hr & low sat 88%

## 2024-03-09 ENCOUNTER — Ambulatory Visit: Payer: Self-pay | Admitting: Primary Care

## 2024-03-09 NOTE — Progress Notes (Signed)
 Please schedule virtual visit to review HST today at [facility]

## 2024-03-09 NOTE — Telephone Encounter (Signed)
 Scheduled virtual visit first available

## 2024-03-16 NOTE — Telephone Encounter (Signed)
 I called and spoke to pt. Pt informed that Matthew Ferrari, NP would like a virtual appt to review results. Pt has been scheduled for 03-25-2024. NFN

## 2024-03-17 NOTE — Progress Notes (Signed)
 Pt is scheduled for 03-25-24. NFN

## 2024-03-25 ENCOUNTER — Ambulatory Visit: Admitting: Primary Care

## 2024-05-23 ENCOUNTER — Ambulatory Visit: Admitting: Podiatry

## 2024-08-03 ENCOUNTER — Ambulatory Visit: Admitting: Primary Care
